# Patient Record
Sex: Male | Born: 1965 | Race: White | Hispanic: No | Marital: Married | Smoking: Never smoker
Health system: Western US, Academic
[De-identification: ages and names within clinical notes are randomized; demographics above are authoritative.]

## PROBLEM LIST (undated history)

## (undated) DIAGNOSIS — N200 Calculus of kidney: Secondary | ICD-10-CM

## (undated) DIAGNOSIS — F419 Anxiety disorder, unspecified: Secondary | ICD-10-CM

## (undated) HISTORY — PX: VASECTOMY REVERSAL: SHX243

## (undated) HISTORY — PX: VASECTOMY: SHX75

## (undated) HISTORY — PX: WISDOM TOOTH EXTRACTION: SHX21

## (undated) HISTORY — PX: KNEE SURGERY: SHX244

## (undated) HISTORY — PX: TONSILLECTOMY: SUR1361

## (undated) HISTORY — DX: Calculus of kidney: N20.0

---

## 2004-02-06 ENCOUNTER — Emergency Department (HOSPITAL_COMMUNITY): Admission: EM | Admit: 2004-02-06 | Discharge: 2004-02-06 | Payer: Self-pay | Admitting: Emergency Medicine

## 2005-04-03 ENCOUNTER — Ambulatory Visit: Payer: Self-pay | Admitting: Family Medicine

## 2005-07-05 ENCOUNTER — Ambulatory Visit: Payer: Self-pay | Admitting: Internal Medicine

## 2006-01-03 ENCOUNTER — Ambulatory Visit: Payer: Self-pay | Admitting: Internal Medicine

## 2006-01-07 ENCOUNTER — Ambulatory Visit: Payer: Self-pay | Admitting: Internal Medicine

## 2006-01-14 ENCOUNTER — Ambulatory Visit: Payer: Self-pay | Admitting: Internal Medicine

## 2007-02-13 ENCOUNTER — Ambulatory Visit: Payer: Self-pay | Admitting: Internal Medicine

## 2007-09-18 ENCOUNTER — Ambulatory Visit: Payer: Self-pay | Admitting: Internal Medicine

## 2007-09-18 DIAGNOSIS — M722 Plantar fascial fibromatosis: Secondary | ICD-10-CM | POA: Insufficient documentation

## 2007-09-21 ENCOUNTER — Telehealth (INDEPENDENT_AMBULATORY_CARE_PROVIDER_SITE_OTHER): Payer: Self-pay | Admitting: *Deleted

## 2007-10-15 ENCOUNTER — Telehealth (INDEPENDENT_AMBULATORY_CARE_PROVIDER_SITE_OTHER): Payer: Self-pay | Admitting: *Deleted

## 2008-01-01 ENCOUNTER — Encounter: Payer: Self-pay | Admitting: Internal Medicine

## 2008-08-01 ENCOUNTER — Telehealth: Payer: Self-pay | Admitting: Internal Medicine

## 2008-08-12 ENCOUNTER — Ambulatory Visit (HOSPITAL_COMMUNITY): Admission: RE | Admit: 2008-08-12 | Discharge: 2008-08-12 | Payer: Self-pay | Admitting: Gastroenterology

## 2009-02-21 ENCOUNTER — Encounter: Payer: Self-pay | Admitting: Internal Medicine

## 2009-02-28 ENCOUNTER — Encounter: Payer: Self-pay | Admitting: Internal Medicine

## 2009-03-16 ENCOUNTER — Emergency Department (HOSPITAL_COMMUNITY): Admission: EM | Admit: 2009-03-16 | Discharge: 2009-03-16 | Payer: Self-pay | Admitting: Emergency Medicine

## 2011-02-25 ENCOUNTER — Ambulatory Visit (INDEPENDENT_AMBULATORY_CARE_PROVIDER_SITE_OTHER): Payer: BC Managed Care – PPO | Admitting: Family Medicine

## 2011-02-25 ENCOUNTER — Encounter: Payer: Self-pay | Admitting: Family Medicine

## 2011-02-25 VITALS — BP 112/82 | Temp 97.9°F | Ht 70.5 in | Wt 220.0 lb

## 2011-02-25 DIAGNOSIS — J209 Acute bronchitis, unspecified: Secondary | ICD-10-CM

## 2011-02-25 NOTE — Progress Notes (Signed)
Healthy nonsmoker who is seen with onset 5 days ago sore throat and mild headache. Developed acute body aches. No fever. Congestion initially nasal he mailed in his chest. Cough occasionally productive of yellow sputum. Patient has no history of asthma and no history of smoking. Denies any nausea, vomiting, or diarrhea. No sick contacts. His continued exercise regularly.   Past medical history reveals no chronic medical problems  Review of systems as per history of present illness.  Physical examination patient is alert and healthy in appearance. Oropharynx is pink mucosa and no erythema or exudate. Dear tones are normal  Neck is supple no adenopathy Chest clear to auscultation no wheezes or rales  Heart regular rhythm and rate no murmur  Skin no rash    acute bronchitis. Suspect viral. Observe for now. Avoid strenuous exercise until this is resolving

## 2011-02-25 NOTE — Patient Instructions (Signed)
Bronchitis   Bronchitis is the body's way of reacting to injury and/or infection (inflammation) of the bronchi. Bronchi are the air tubes that extend from the windpipe into the lungs. If the inflammation becomes severe, it may cause shortness of breath.  CAUSES Inflammation may be caused by:  A virus.  Germs (bacteria).  Dust.  Allergens.  Pollutants and many other irritants.    The cells lining this bronchial tree are covered with tiny hairs (cilia). These constantly beat upward away from the lungs towards the mouth. This keeps the lung free of pollutants. When these cells become too irritated and unable to do their job, mucus begins to develop. This causes the characteristic cough of bronchitis. The cough clears our lungs when the cilia are unable to do their job. Without either of these protective mechanisms, the mucus would settle in our lungs. We would then develop pneumonia.    Smoking is a common cause of bronchitis and can contribute to pneumonia. Stopping this habit is the single most important thing you can do to help yourself.   TREATMENT  Your caregiver may prescribe an antibiotic if your cough is caused by bacteria. Also, medicines that open up your airways make it easier to breathe. He/she may also recommend or prescribe an expectorant. It will loosen the mucus to be coughed up. Only take over-the-counter or prescription medicines for pain, discomfort, or fever as directed by your caregiver.   Removing whatever causes the problem (smoking, for example) is critical to preventing the problem from getting worse.  Cough suppressants may be prescribed for relief of cough symptoms.  Inhaled medicines may be prescribed to help with symptoms now and to help prevent problems from returning.  For those with chronic (recurrent) bronchitis, there may be a need for steroid medicines.   SEEK IMMEDIATE MEDICAL CARE IF:  During your treatment you develop more pus-like (purulent)  sputum.  You have a fever uncontrolled by medicine.  You become progressively more ill.   You have increased difficulty breathing, have wheezing, or shortness of breath.   It is necessary to seek immediate medical care if you are elderly or sick from any other disease.   MAKE SURE YOU:   Understand these instructions.   Will watch your condition.  Will get help right away if you are not doing well or get worse.   Document Released: 12/16/2005  Document Re-Released: 01/07/2010 [facility] Patient Information 2011 [place], [place].

## 2011-05-17 NOTE — Assessment & Plan Note (Signed)
[facility] OFFICE NOTE   NAME:Luis Delgado, Luis Delgado                       MRN:          [id number]  DATE:02/13/2007                            DOB:          08-08-1966    A 45 year old gentleman seen today with some neurologic concerns.  He  had similar concerns 13 months ago where he noticed some perhaps  unsteadiness of his left leg.  This has not been progressive.  He also  feels that he is having some difficulty pronouncing some words but this  has not been noticed by his wife or any other observers.  He continues  to be quite active with basketball and jogging.  He also described some  tightness in both legs and perhaps some increasing tremor involving the  hands.   Exam today revealed him to be in no distress.  Blood pressure was  controlled, 130/82 on repeat.  There is no drift to the outstretched arm  and no obvious tremor.  There was no obvious dystaxia.  Finger-to-nose  testing, fine motor activity, rapid alternating movements were all  performed quite well.  Romberg was negative.  He was able to do a tandem  walk without difficulty.  He was able to hop on either foot without  problems.   IMPRESSION:  Unremarkable clinical examination.   DISPOSITION:  Options were discussed.  He was told the likelihood of him  having any serious neurological deficit is quite low.  He will consider  things over the weekend, and if he desires further testing for peace of  mind, will re-contact the office.     Gordy Savers, MD  Electronically Signed    PFK/MedQ  DD: 02/13/2007  DT: 02/13/2007  Job #: [id number]

## 2011-08-16 ENCOUNTER — Ambulatory Visit (INDEPENDENT_AMBULATORY_CARE_PROVIDER_SITE_OTHER): Payer: BC Managed Care – PPO | Admitting: Internal Medicine

## 2011-08-16 ENCOUNTER — Encounter: Payer: Self-pay | Admitting: Internal Medicine

## 2011-08-16 VITALS — BP 126/80 | Temp 98.0°F | Wt 223.0 lb

## 2011-08-16 DIAGNOSIS — K625 Hemorrhage of anus and rectum: Secondary | ICD-10-CM

## 2011-08-16 MED ORDER — HYDROCORTISONE 2.5 % RE CREA: TOPICAL_CREAM | RECTAL | Status: AC

## 2011-08-16 NOTE — Patient Instructions (Signed)
Annual exam as scheduled  Call or return to clinic prn if these symptoms worsen or fail to improve as anticipated.  

## 2011-08-16 NOTE — Progress Notes (Signed)
  Subjective:    Patient ID: Luis Delgado, male    DOB: Jun 24, 1966, 45 y.o.   MRN: [id number]  HPI  45 year old patient who presents with a several month history is scanty bright red rectal bleeding. He describes some mild rectal irritation and itching. He notices frequently bright red blood on the tissue paper. His bowel habits otherwise have been unchanged. He is scheduled for a physical in a month or so;  he has not used any preparations such as Anusol.    Review of Systems  Gastrointestinal: Positive for blood in stool.       Objective:   Physical Exam  Constitutional: He appears well-developed and well-nourished. No distress.       Blood pressure controlled  Genitourinary:       Stool is normal color and hematest negative Some prominent perirectal mucosa but no obvious hemorrhoid noted. No obvious bleeding demonstrated. No rectal mass          Assessment & Plan:   Bright red rectal bleeding. Probable small internal hemorrhoid. Will treat with Anusol-HC cream and observe. If symptoms persist will set up for general surgical evaluation. He is scheduled for a physical in one or 2 months we'll reassess at that time

## 2011-09-13 ENCOUNTER — Encounter: Payer: Self-pay | Admitting: Internal Medicine

## 2011-09-13 ENCOUNTER — Ambulatory Visit (INDEPENDENT_AMBULATORY_CARE_PROVIDER_SITE_OTHER): Payer: BC Managed Care – PPO | Admitting: Internal Medicine

## 2011-09-13 VITALS — BP 102/68 | HR 72 | Temp 98.0°F | Resp 18 | Ht 70.75 in | Wt 218.0 lb

## 2011-09-13 DIAGNOSIS — Z0000 Encounter for general adult medical examination without abnormal findings: Secondary | ICD-10-CM

## 2011-09-13 LAB — CBC WITH DIFFERENTIAL/PLATELET
Basophils Absolute: 0 10*3/uL (ref 0.0–0.1)
Eosinophils Absolute: 0 10*3/uL (ref 0.0–0.7)
Lymphocytes Relative: 20.2 % (ref 12.0–46.0)
MCHC: 34.1 g/dL (ref 30.0–36.0)
Neutrophils Relative %: 68.1 % (ref 43.0–77.0)
RDW: 13.7 % (ref 11.5–14.6)
WBC: 6.1 10*3/uL (ref 4.5–10.5)

## 2011-09-13 LAB — COMPREHENSIVE METABOLIC PANEL
ALT: 25 U/L (ref 0–53)
Alkaline Phosphatase: 92 U/L (ref 39–117)
Creatinine, Ser: 1.1 mg/dL (ref 0.4–1.5)
Glucose, Bld: 98 mg/dL (ref 70–99)
Sodium: 139 mEq/L (ref 135–145)
Total Bilirubin: 0.7 mg/dL (ref 0.3–1.2)
Total Protein: 7.1 g/dL (ref 6.0–8.3)

## 2011-09-13 LAB — LIPID PANEL
HDL: 59.1 mg/dL (ref >39.00)
Total CHOL/HDL Ratio: 3
Triglycerides: 47 mg/dL (ref 0.0–149.0)
VLDL: 9.4 mg/dL (ref 0.0–40.0)

## 2011-09-13 NOTE — Progress Notes (Signed)
  Subjective:    Patient ID: Luis Delgado, male    DOB: 1966-10-02, 45 y.o.   MRN: [id number]  HPI  45 year old patient who is seen today for an annual physical.   He enjoys excellent health and is quite active physically. Due to some atypical chest pain he had a cardiac evaluation done earlier in the year that included a nuclear medicine stress test. New concerns or complaints  Family history father age 81 status post CABG at age 78. Mother age 71. Both parents with diabetes 3 brothers in good health  Social history married 2 children high school age 68 adopted younger children. Employed at [employer]    Review of Systems  Constitutional: Negative for fever, chills, activity change, appetite change and fatigue.  HENT: Negative for hearing loss, ear pain, congestion, rhinorrhea, sneezing, mouth sores, trouble swallowing, neck pain, neck stiffness, dental problem, voice change, sinus pressure and tinnitus.   Eyes: Negative for photophobia, pain, redness and visual disturbance.  Respiratory: Negative for apnea, cough, choking, chest tightness, shortness of breath and wheezing.   Cardiovascular: Negative for chest pain, palpitations and leg swelling.  Gastrointestinal: Negative for nausea, vomiting, abdominal pain, diarrhea, constipation, blood in stool, abdominal distention, anal bleeding and rectal pain.  Genitourinary: Negative for dysuria, urgency, frequency, hematuria, flank pain, decreased urine volume, discharge, penile swelling, scrotal swelling, difficulty urinating, genital sores and testicular pain.  Musculoskeletal: Negative for myalgias, back pain, joint swelling, arthralgias and gait problem.  Skin: Negative for color change, rash and wound.  Neurological: Negative for dizziness, tremors, seizures, syncope, facial asymmetry, speech difficulty, weakness, light-headedness, numbness and headaches.  Hematological: Negative for adenopathy. Does not bruise/bleed easily.    Psychiatric/Behavioral: Negative for suicidal ideas, hallucinations, behavioral problems, confusion, sleep disturbance, self-injury, dysphoric mood, decreased concentration and agitation. The patient is not nervous/anxious.        Objective:   Physical Exam  Constitutional: He appears well-developed and well-nourished.  HENT:  Head: Normocephalic and atraumatic.  Right Ear: External ear normal.  Left Ear: External ear normal.  Nose: Nose normal.  Mouth/Throat: Oropharynx is clear and moist.  Eyes: Conjunctivae and EOM are normal. Pupils are equal, round, and reactive to light. No scleral icterus.  Neck: Normal range of motion. Neck supple. No JVD present. No thyromegaly present.  Cardiovascular: Regular rhythm, normal heart sounds and intact distal pulses.  Exam reveals no gallop and no friction rub.   No murmur heard. Pulmonary/Chest: Effort normal and breath sounds normal. He exhibits no tenderness.  Abdominal: Soft. Bowel sounds are normal. He exhibits no distension and no mass. There is no tenderness.  Genitourinary: Penis normal.  Musculoskeletal: Normal range of motion. He exhibits no edema and no tenderness.  Lymphadenopathy:    He has no cervical adenopathy.  Neurological: He is alert. He has normal reflexes. No cranial nerve deficit. Coordination normal.  Skin: Skin is warm and dry. No rash noted.  Psychiatric: He has a normal mood and affect. His behavior is normal.          Assessment & Plan:     Preventive health examination. Exercise will be continued;  modest weight loss encouraged a fasting panel will be reviewed. He return in one year or when necessary

## 2011-09-13 NOTE — Patient Instructions (Signed)
It is important that you exercise regularly, at least 20 minutes 3 to 4 times per week.  If you develop chest pain or shortness of breath seek  medical attention.  You need to lose weight.  Consider a lower calorie diet and regular exercise. 

## 2013-01-21 ENCOUNTER — Telehealth: Payer: Self-pay | Admitting: Internal Medicine

## 2013-01-21 MED ORDER — ATOVAQUONE-PROGUANIL HCL 250-100 MG PO TABS: 1.0000 | ORAL_TABLET | Freq: Every day | ORAL | Status: DC

## 2013-01-21 NOTE — Telephone Encounter (Signed)
Rx sent to pharmacy and patient is aware 

## 2013-01-21 NOTE — Telephone Encounter (Signed)
Patient Information:  Caller Name: Caedmon  Phone: [phone]  Patient: Luis, Delgado  Gender: Male  DOB: 06-02-1966  Age: 47 Years  PCP: Eleonore Chiquito [facility])  Office Follow Up:  Does the office need to follow up with this patient?: Yes  Instructions For The Office: See RN Note. Please call patient regarding prescription.  RN Note:  Patient requesting Malarone (anti-malarial) prescription for travel to [place] on 02.01.2014. Patient's preferred pharmacy is [facility] on [address] in [place], [place]. Patient will await call back regarding prescription.  Symptoms  Reason For Call & Symptoms: Requesting Malarone prescription for traval to [place] on 02.01.14  Reviewed Health History In EMR: Yes  Reviewed Medications In EMR: Yes  Reviewed Allergies In EMR: Yes  Reviewed Surgeries / Procedures: Yes  Date of Onset of Symptoms: 01/21/2013  Guideline(s) Used:  No Protocol Available - Information Only  Disposition Per Guideline:   Discuss with PCP and Callback by Nurse Today  Reason For Disposition Reached:   Nursing judgment  Advice Given:  N/A

## 2013-03-04 ENCOUNTER — Other Ambulatory Visit (INDEPENDENT_AMBULATORY_CARE_PROVIDER_SITE_OTHER): Payer: BC Managed Care – PPO

## 2013-03-04 DIAGNOSIS — Z0000 Encounter for general adult medical examination without abnormal findings: Secondary | ICD-10-CM

## 2013-03-04 LAB — CBC WITH DIFFERENTIAL/PLATELET
Basophils Relative: 0.7 % (ref 0.0–3.0)
Eosinophils Relative: 1.4 % (ref 0.0–5.0)
HCT: 46.5 % (ref 39.0–52.0)
Hemoglobin: 15.7 g/dL (ref 13.0–17.0)
Lymphs Abs: 2.2 10*3/uL (ref 0.7–4.0)
MCV: 89.1 fl (ref 78.0–100.0)
Monocytes Absolute: 0.6 10*3/uL (ref 0.1–1.0)
RBC: 5.22 Mil/uL (ref 4.22–5.81)
WBC: 6.9 10*3/uL (ref 4.5–10.5)

## 2013-03-04 LAB — POCT URINALYSIS DIPSTICK
Ketones, UA: NEGATIVE
Leukocytes, UA: NEGATIVE
Protein, UA: NEGATIVE
pH, UA: 5.5

## 2013-03-04 LAB — LIPID PANEL
Total CHOL/HDL Ratio: 3
VLDL: 11.4 mg/dL (ref 0.0–40.0)

## 2013-03-04 LAB — HEPATIC FUNCTION PANEL
ALT: 27 U/L (ref 0–53)
Bilirubin, Direct: 0.1 mg/dL (ref 0.0–0.3)
Total Protein: 7.2 g/dL (ref 6.0–8.3)

## 2013-03-04 LAB — BASIC METABOLIC PANEL
Chloride: 105 mEq/L (ref 96–112)
Potassium: 4.1 mEq/L (ref 3.5–5.1)

## 2013-03-04 LAB — TSH: TSH: 2.66 u[IU]/mL (ref 0.35–5.50)

## 2013-03-16 ENCOUNTER — Encounter: Payer: Self-pay | Admitting: Internal Medicine

## 2013-03-16 ENCOUNTER — Ambulatory Visit (INDEPENDENT_AMBULATORY_CARE_PROVIDER_SITE_OTHER): Payer: BC Managed Care – PPO | Admitting: Internal Medicine

## 2013-03-16 VITALS — BP 146/90 | HR 66 | Temp 98.0°F | Resp 18 | Ht 69.75 in | Wt 220.0 lb

## 2013-03-16 DIAGNOSIS — Z0000 Encounter for general adult medical examination without abnormal findings: Secondary | ICD-10-CM

## 2013-03-16 NOTE — Progress Notes (Signed)
  Subjective:    Patient ID: Luis Delgado, male    DOB: Mar 03, 1966, 47 y.o.   MRN: [id number]  HPI   47 year-old patient who is seen today for an annual physical.   He enjoys excellent health and is quite active physically. Due to some atypical chest pain he had a cardiac evaluation done last  year that included a nuclear medicine stress test. New concerns or complaints  Family history father age 88 status post CABG at age 622. Mother age 7. Both parents with diabetes 3 brothers in good health  Social history married 2 children high school age 63 adopted younger children. Employed at [employer]    Review of Systems  Constitutional: Negative for fever, chills, activity change, appetite change and fatigue.  HENT: Negative for hearing loss, ear pain, congestion, rhinorrhea, sneezing, mouth sores, trouble swallowing, neck pain, neck stiffness, dental problem, voice change, sinus pressure and tinnitus.   Eyes: Negative for photophobia, pain, redness and visual disturbance.  Respiratory: Negative for apnea, cough, choking, chest tightness, shortness of breath and wheezing.   Cardiovascular: Negative for chest pain, palpitations and leg swelling.  Gastrointestinal: Negative for nausea, vomiting, abdominal pain, diarrhea, constipation, blood in stool, abdominal distention, anal bleeding and rectal pain.  Genitourinary: Negative for dysuria, urgency, frequency, hematuria, flank pain, decreased urine volume, discharge, penile swelling, scrotal swelling, difficulty urinating, genital sores and testicular pain.  Musculoskeletal: Negative for myalgias, back pain, joint swelling, arthralgias and gait problem.  Skin: Negative for color change, rash and wound.  Neurological: Negative for dizziness, tremors, seizures, syncope, facial asymmetry, speech difficulty, weakness, light-headedness, numbness and headaches.  Hematological: Negative for adenopathy. Does not bruise/bleed easily.   Psychiatric/Behavioral: Negative for suicidal ideas, hallucinations, behavioral problems, confusion, sleep disturbance, self-injury, dysphoric mood, decreased concentration and agitation. The patient is not nervous/anxious.        Objective:   Physical Exam  Constitutional: He appears well-developed and well-nourished.  HENT:  Head: Normocephalic and atraumatic.  Right Ear: External ear normal.  Left Ear: External ear normal.  Nose: Nose normal.  Mouth/Throat: Oropharynx is clear and moist.  Eyes: Conjunctivae and EOM are normal. Pupils are equal, round, and reactive to light. No scleral icterus.  Neck: Normal range of motion. Neck supple. No JVD present. No thyromegaly present.  Cardiovascular: Regular rhythm, normal heart sounds and intact distal pulses.  Exam reveals no gallop and no friction rub.   No murmur heard. Pulmonary/Chest: Effort normal and breath sounds normal. He exhibits no tenderness.  Abdominal: Soft. Bowel sounds are normal. He exhibits no distension and no mass. There is no tenderness.  Genitourinary: Prostate normal and penis normal. Guaiac negative stool.  Musculoskeletal: Normal range of motion. He exhibits no edema and no tenderness.  Lymphadenopathy:    He has no cervical adenopathy.  Neurological: He is alert. He has normal reflexes. No cranial nerve deficit. Coordination normal.  Skin: Skin is warm and dry. No rash noted.  Psychiatric: He has a normal mood and affect. His behavior is normal.          Assessment & Plan:     Preventive health examination. Exercise will be continued;  modest weight loss encouraged a fasting panel will be reviewed. He return in one year or when necessary

## 2013-03-16 NOTE — Patient Instructions (Signed)
It is important that you exercise regularly, at least 20 minutes 3 to 4 times per week.  If you develop chest pain or shortness of breath seek  medical attention.   

## 2013-07-12 ENCOUNTER — Telehealth: Payer: Self-pay | Admitting: Internal Medicine

## 2013-07-12 MED ORDER — ATOVAQUONE-PROGUANIL HCL 62.5-25 MG PO TABS: 1.0000 | ORAL_TABLET | Freq: Every day | ORAL | Status: DC

## 2013-07-12 NOTE — Telephone Encounter (Signed)
malarone  #20  One daily starting 2 days prior to departure

## 2013-07-12 NOTE — Telephone Encounter (Signed)
PT is calling to request a RX to prevent Malaria. He states that he'll be leaving for [place] on 7/31 and will be there until 8/9. He would like this RX called into [facility] on [address]. Please assist.

## 2013-07-12 NOTE — Telephone Encounter (Signed)
Left message on voicemail to call office. Rx sent

## 2013-07-13 NOTE — Telephone Encounter (Signed)
PT aware

## 2013-07-14 ENCOUNTER — Other Ambulatory Visit: Payer: Self-pay | Admitting: *Deleted

## 2013-07-14 MED ORDER — ATOVAQUONE-PROGUANIL HCL 250-100 MG PO TABS: 1.0000 | ORAL_TABLET | Freq: Every day | ORAL | Status: DC

## 2013-07-14 NOTE — Telephone Encounter (Signed)
Spoke to pt told him correct Rx sent to pharmacy originally sent pediatric dose and pharmacy contacted me. Rx will be ready later today. Told pt need to start Rx two days prior to departure and take one tablet daily x 20 days and take same time everyday and take with food or milk. Pt verbalized understanding.

## 2013-07-21 ENCOUNTER — Encounter: Payer: Self-pay | Admitting: Family Medicine

## 2013-07-21 ENCOUNTER — Ambulatory Visit (INDEPENDENT_AMBULATORY_CARE_PROVIDER_SITE_OTHER): Payer: BC Managed Care – PPO | Admitting: Family Medicine

## 2013-07-21 VITALS — BP 112/74 | HR 74 | Temp 98.0°F | Wt 220.0 lb

## 2013-07-21 DIAGNOSIS — Z5189 Encounter for other specified aftercare: Secondary | ICD-10-CM

## 2013-07-21 DIAGNOSIS — T24201D Burn of second degree of unspecified site of right lower limb, except ankle and foot, subsequent encounter: Secondary | ICD-10-CM

## 2013-07-21 MED ORDER — SILVER SULFADIAZINE 1 % EX CREA: TOPICAL_CREAM | Freq: Two times a day (BID) | CUTANEOUS | Status: DC

## 2013-07-21 MED ORDER — CEPHALEXIN 500 MG PO CAPS: 500.0000 mg | ORAL_CAPSULE | Freq: Two times a day (BID) | ORAL | Status: DC
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
## 2013-07-26 ENCOUNTER — Telehealth: Payer: Self-pay | Admitting: Internal Medicine
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
## 2013-07-27 ENCOUNTER — Encounter: Payer: Self-pay | Admitting: Internal Medicine

## 2013-07-27 ENCOUNTER — Ambulatory Visit (INDEPENDENT_AMBULATORY_CARE_PROVIDER_SITE_OTHER): Payer: BC Managed Care – PPO | Admitting: Internal Medicine

## 2013-07-27 VITALS — BP 120/80 | HR 68 | Temp 98.1°F | Resp 20 | Wt 220.0 lb

## 2013-07-27 DIAGNOSIS — Z23 Encounter for immunization: Secondary | ICD-10-CM

## 2013-07-27 DIAGNOSIS — T24201S Burn of second degree of unspecified site of right lower limb, except ankle and foot, sequela: Secondary | ICD-10-CM

## 2013-07-27 DIAGNOSIS — IMO0002 Reserved for concepts with insufficient information to code with codable children: Secondary | ICD-10-CM

## 2013-07-27 NOTE — Patient Instructions (Signed)
Call or return to clinic prn if these symptoms worsen or fail to improve as anticipated. Burn Care Your skin is a natural barrier to infection. It is the largest organ of your body. Burns damage this natural protection. To help prevent infection, it is very important to follow your caregiver's instructions in the care of your burn. Burns are classified as:  First degree. There is only redness of the skin (erythema). No scarring is expected.  Second degree. There is blistering of the skin. Scarring may occur with deeper burns.  Third degree. All layers of the skin are injured, and scarring is expected. HOME CARE INSTRUCTIONS   Wash your hands well before changing your bandage.  Change your bandage as often as directed by your caregiver.  Remove the old bandage. If the bandage sticks, you may soak it off with cool, clean water.  Cleanse the burn thoroughly but gently with mild soap and water.  Pat the area dry with a clean, dry cloth.  Apply a thin layer of antibacterial cream to the burn.  Apply a clean bandage as instructed by your caregiver.  Keep the bandage as clean and dry as possible.  Elevate the affected area for the first 24 hours, then as instructed by your caregiver.  Only take over-the-counter or prescription medicines for pain, discomfort, or fever as directed by your caregiver. SEEK IMMEDIATE MEDICAL CARE IF:   You develop excessive pain.  You develop redness, tenderness, swelling, or red streaks near the burn.  The burned area develops yellowish-white fluid (pus) or a bad smell.  You have a fever. MAKE SURE YOU:   Understand these instructions.  Will watch your condition.  Will get help right away if you are not doing well or get worse. Document Released: 12/16/2005 Document Revised: 03/09/2012 Document Reviewed: 05/08/2011 [facility] Patient Information 2014 [place], [place].
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
## 2013-08-16 ENCOUNTER — Telehealth: Payer: Self-pay | Admitting: Internal Medicine

## 2013-08-16 MED ORDER — ATOVAQUONE-PROGUANIL HCL 250-100 MG PO TABS: 1.0000 | ORAL_TABLET | Freq: Every day | ORAL | Status: DC

## 2013-08-16 NOTE — Telephone Encounter (Signed)
PT is calling to request a RX to prevent Malaria. He states that he'll be leaving for [place] on 8/22 and will be returning on 8/7. He would like this RX called into [facility] on [address]. Please assist.

## 2013-08-16 NOTE — Telephone Encounter (Signed)
Spoke to pt told him Rx sent to pharmacy same directions as last time. Pt verbalized understanding.

## 2013-08-27 ENCOUNTER — Telehealth: Payer: Self-pay | Admitting: Internal Medicine

## 2013-08-27 NOTE — Telephone Encounter (Signed)
Discontinue Malarone.  Suggest a insect repellent if he is not already using

## 2013-08-27 NOTE — Telephone Encounter (Signed)
Spoke to Vernona Rieger pt's wife told her to tell him to stop Malarone and use insect repellent if not already using. Vernona Rieger verbalized understanding and will let pt know.

## 2013-08-27 NOTE — Telephone Encounter (Signed)
Patient Information:  Caller Name: Vernona Rieger  Phone: [phone]  Patient: Luis Delgado, Luis Delgado  Gender: Male  DOB: Oct 26, 1966  Age: 47 Years  PCP: Eleonore Chiquito (Family Practice > 46yrs old)  Office Follow Up:  Does the office need to follow up with this patient?: Yes  Instructions For The Office: Wife /Laura  requesting recommendation for Marquavion who is out of country in [place]. He is taking Malarone and thinks it is making him sick. Having nausea, no appetite, dizzy and not sleeping.   Symptoms  Reason For Call & Symptoms: Vernona Rieger /Wife states Jovanie is in [place].  Vernona Rieger states that  Christos thinks the prophylactic Malarone is making him sick, nausea , dizzy, no appetiteand trouble sleeping. Vernona Rieger states the " incidence of malaria in  [place] is low." Requesting recommendations.  Per no protocol for sick adult has discuss with PCP and callback by nurse today. PLEASE CALL LAURA st [phone].  Reviewed Health History In EMR: Yes  Reviewed Medications In EMR: Yes  Reviewed Allergies In EMR: Yes  Reviewed Surgeries / Procedures: Yes  Date of Onset of Symptoms: 08/23/2013  Treatments Tried: Has Pepto Bismol Tabs with him  Treatments Tried Worked: No  Guideline(s) Used:  No Protocol Available - Sick Adult  Disposition Per Guideline:   Discuss with PCP and Callback by Nurse within 1 Hour  Reason For Disposition Reached:   Nursing judgment  Advice Given:  N/A  Patient Will Follow Care Advice:  YES

## 2013-09-12 ENCOUNTER — Encounter (HOSPITAL_COMMUNITY): Payer: Self-pay

## 2013-09-12 ENCOUNTER — Emergency Department (HOSPITAL_COMMUNITY)
Admission: EM | Admit: 2013-09-12 | Discharge: 2013-09-12 | Disposition: A | Discharge status: Home / Self Care | Payer: BC Managed Care – PPO | Attending: Emergency Medicine | Admitting: Emergency Medicine

## 2013-09-12 DIAGNOSIS — Z79899 Other long term (current) drug therapy: Secondary | ICD-10-CM | POA: Insufficient documentation

## 2013-09-12 DIAGNOSIS — Z792 Long term (current) use of antibiotics: Secondary | ICD-10-CM | POA: Insufficient documentation

## 2013-09-12 DIAGNOSIS — F419 Anxiety disorder, unspecified: Secondary | ICD-10-CM

## 2013-09-12 DIAGNOSIS — F411 Generalized anxiety disorder: Secondary | ICD-10-CM | POA: Insufficient documentation

## 2013-09-12 DIAGNOSIS — R42 Dizziness and giddiness: Secondary | ICD-10-CM

## 2013-09-12 HISTORY — DX: Anxiety disorder, unspecified: F41.9

## 2013-09-12 NOTE — ED Provider Notes (Signed)
CSN: [id number]     Arrival date & time 09/12/13  1557 History   First MD Initiated Contact with Patient 09/12/13 1615     Chief Complaint  Patient presents with  . Anxiety  . Dizziness   (Consider location/radiation/quality/duration/timing/severity/associated sxs/prior Treatment) HPI Comments: 47 year old male who reports a long-standing history of anxiety presenting with dizziness which he believes is secondary to his anxiety. He states his symptoms have ebbed and flowed over the years.  He reports a lightheaded feeling and in noodleing feeling in his legs. He denies syncope. He denies vomiting. He denies worse symptoms in the morning. He denies weight loss. He denies fevers. He denies numbness or weakness.  Patient is a 47 y.o. male presenting with anxiety.  Anxiety This is a chronic problem. Episode onset: worse over past several months. Episode frequency: intermittetn. The problem has been gradually worsening. Pertinent negatives include no chest pain, no abdominal pain and no shortness of breath. Associated symptoms comments: dizziness. Nothing aggravates the symptoms. Nothing relieves the symptoms.    Past Medical History  Diagnosis Date  . Anxiety    Past Surgical History  Procedure Laterality Date  . Vasectomy reversal    . Vasectomy    . Tonsillectomy     History reviewed. No pertinent family history. History  Substance Use Topics  . Smoking status: Never Smoker   . Smokeless tobacco: Never Used  . Alcohol Use: No    Review of Systems  Constitutional: Negative for fever.  HENT: Negative for congestion.   Respiratory: Negative for cough and shortness of breath.   Cardiovascular: Negative for chest pain.  Gastrointestinal: Negative for nausea, vomiting, abdominal pain and diarrhea.  All other systems reviewed and are negative.    Allergies  Review of patient's allergies indicates no known allergies.  Home Medications   Current Outpatient Rx  Name  Route   Sig  Dispense  Refill  . atovaquone-proguanil (MALARONE) 250-100 MG TABS   Oral   Take 1 tablet by mouth daily.   20 tablet   0   . cephALEXin (KEFLEX) 500 MG capsule   Oral   Take 1 capsule (500 mg total) by mouth 2 (two) times daily.   20 capsule   0   . silver sulfADIAZINE (SILVADENE) 1 % cream   Topical   Apply topically 2 (two) times daily.   400 g   0    BP 135/79  Pulse 68  Temp(Src) 98 F (36.7 C)  Resp 18  Ht 5\' 11"  (1.803 m)  Wt 215 lb (97.523 kg)  BMI 30 kg/m2  SpO2 96% Physical Exam  Nursing note and vitals reviewed. Constitutional: He is oriented to person, place, and time. He appears well-developed and well-nourished. No distress.  HENT:  Head: Normocephalic and atraumatic.  Mouth/Throat: Oropharynx is clear and moist.  Eyes: Conjunctivae are normal. Pupils are equal, round, and reactive to light. No scleral icterus.  Neck: Neck supple.  Cardiovascular: Normal rate, regular rhythm, normal heart sounds and intact distal pulses.   No murmur heard. Pulmonary/Chest: Effort normal and breath sounds normal. No stridor. No respiratory distress. He has no wheezes. He has no rales.  Abdominal: Soft. He exhibits no distension. There is no tenderness.  Musculoskeletal: Normal range of motion. He exhibits no edema.  Neurological: He is alert and oriented to person, place, and time. He has normal strength. No cranial nerve deficit or sensory deficit. Coordination and gait normal. GCS eye subscore is 4. GCS verbal subscore  is 5. GCS motor subscore is 6.  Reflex Scores:      Patellar reflexes are 2+ on the right side and 2+ on the left side. Skin: Skin is warm and dry. No rash noted.  Psychiatric: He has a normal mood and affect. His behavior is normal.    ED Course  Procedures (including critical care time) Labs Review Labs Reviewed - No data to display Imaging Review No results found.  MDM   1. Dizziness   2. Anxiety    47 year old male presenting with  dizziness in the setting of worsening anxiety. He does not appear anxious on my exam endorses minimal symptoms at this time. I spent much time discussing his symptoms, and he ultimately felt that he needs to talk with someone regarding his anxiety. He was offered resources for this and advised to followup with his primary physician as well. He declined medications at this time. He was very well appearing with normal neurologic exam. Normal vital signs. Normal gait. I do not suspect CVA or CNS infection.      Candyce Churn, MD 09/12/13 [phone]

## 2013-09-12 NOTE — ED Notes (Signed)
Patient reports having progressively worse dizziness over the past [redacted] weeks along with anxiety. Patient also c/o epigastric pain, but denies CP. Patient states upper and lower extremities "feel weird.". Patient also states he has trouble focusing.

## 2013-09-14 ENCOUNTER — Ambulatory Visit (INDEPENDENT_AMBULATORY_CARE_PROVIDER_SITE_OTHER): Payer: BC Managed Care – PPO | Admitting: Internal Medicine

## 2013-09-14 ENCOUNTER — Encounter: Payer: Self-pay | Admitting: Internal Medicine

## 2013-09-14 VITALS — BP 122/80 | HR 72 | Temp 98.0°F | Resp 20 | Wt 218.0 lb

## 2013-09-14 DIAGNOSIS — F411 Generalized anxiety disorder: Secondary | ICD-10-CM

## 2013-09-14 MED ORDER — CLONAZEPAM 0.5 MG PO TABS: 0.5000 mg | ORAL_TABLET | Freq: Two times a day (BID) | ORAL | Status: DC | PRN

## 2013-09-14 MED ORDER — ESCITALOPRAM OXALATE 10 MG PO TABS: 10.0000 mg | ORAL_TABLET | Freq: Every day | ORAL | Status: DC

## 2013-09-14 NOTE — Progress Notes (Signed)
  Subjective:    Patient ID: Luis Delgado, male    DOB: 1966/02/17, 47 y.o.   MRN: [id number]  HPI 47 year old patient who states that he has a history of anxiety/panic disorder since age 59. In the past he has had the ED visits do to anxiety and chest pain. Over the past several months he has had the increase in generalized anxiety. Associated symptoms include the dizziness. At times he has difficulty sleeping do to intrusive thoughts and  excess  worry.  He also has difficulty with  giving presentations ; in  the past he has been quite comfortable with  public speaking   Past Medical History  Diagnosis Date  . Anxiety     History   Social History  . Marital Status: Married    Spouse Name: N/A    Number of Children: N/A  . Years of Education: N/A   Occupational History  . Not on file.   Social History Main Topics  . Smoking status: Never Smoker   . Smokeless tobacco: Never Used  . Alcohol Use: No  . Drug Use: No  . Sexual Activity: Not on file   Other Topics Concern  . Not on file   Social History Narrative  . No narrative on file    Past Surgical History  Procedure Laterality Date  . Vasectomy reversal    . Vasectomy    . Tonsillectomy      No family history on file.  No Known Allergies  Current Outpatient Prescriptions on File Prior to Visit  Medication Sig Dispense Refill  . Nutritional Supplements (JUICE PLUS FIBRE) LIQD Take 1 Bottle by mouth daily.       No current facility-administered medications on file prior to visit.    BP 122/80  Pulse 72  Temp(Src) 98 F (36.7 C) (Oral)  Resp 20  Wt 218 lb (98.884 kg)  BMI 30.42 kg/m2  SpO2 98%     Review of Systems  Psychiatric/Behavioral: The patient is nervous/anxious.        Objective:   Physical Exam  Constitutional: He is oriented to person, place, and time. He appears well-developed and well-nourished. No distress.  HENT:  Head: Normocephalic.  Right Ear: External ear normal.  Left Ear:  External ear normal.  Eyes: Conjunctivae and EOM are normal.  Neck: Normal range of motion.  Cardiovascular: Normal rate and normal heart sounds.   Pulmonary/Chest: Breath sounds normal.  Abdominal: Bowel sounds are normal.  Musculoskeletal: Normal range of motion. He exhibits no edema and no tenderness.  Neurological: He is alert and oriented to person, place, and time.  Psychiatric: He has a normal mood and affect. His behavior is normal.          Assessment & Plan:  Anxiety disorder. The patient will consider counseling. He will be placed on Lexapro 10 and when necessary Klonopin. Recheck 6 weeks

## 2013-09-14 NOTE — Patient Instructions (Signed)
Return in 6 weeks for follow up

## 2013-10-25 ENCOUNTER — Ambulatory Visit: Payer: BC Managed Care – PPO | Admitting: Internal Medicine

## 2013-12-02 ENCOUNTER — Ambulatory Visit (INDEPENDENT_AMBULATORY_CARE_PROVIDER_SITE_OTHER): Payer: BC Managed Care – PPO | Admitting: *Deleted

## 2013-12-02 DIAGNOSIS — Z23 Encounter for immunization: Secondary | ICD-10-CM

## 2013-12-17 ENCOUNTER — Encounter: Payer: Self-pay | Admitting: *Deleted

## 2013-12-20 ENCOUNTER — Encounter: Payer: Self-pay | Admitting: Internal Medicine

## 2013-12-20 ENCOUNTER — Ambulatory Visit (INDEPENDENT_AMBULATORY_CARE_PROVIDER_SITE_OTHER): Payer: BC Managed Care – PPO | Admitting: Internal Medicine

## 2013-12-20 VITALS — BP 110/80 | Temp 97.4°F | Wt 229.0 lb

## 2013-12-20 DIAGNOSIS — Z23 Encounter for immunization: Secondary | ICD-10-CM

## 2013-12-20 MED ORDER — MEFLOQUINE HCL 250 MG PO TABS: ORAL_TABLET | ORAL | Status: DC

## 2013-12-20 NOTE — Patient Instructions (Signed)
Malaria °Malaria is an illness caused by a parasite, usually an infected mosquito. A parasite is an organism that lives in another host (such as a human) and gets nourishment at the host's expense. °CAUSES  °Malaria is spread from person to person by anopheline mosquitos. Areas where malaria occurs are tropical regions of: °· Sub-Saharan Africa. °· Asia. °· Ocean[place] ([place] and near-by [place]). °· [place]. °SYMPTOMS  °Malaria may cause very mild symptoms, severe disease and even death.  °Following the bite of an infected mosquito, a period of time goes by (incubation period) before the first symptoms appear. The incubation period is typically 8 to 25 days. Patients with uncomplicated malaria typically have a fever of unknown cause. Other common symptoms include: °· Headache. °· Weakness. °· Night sweats. °· Muscle and joint pains. °Fever may come and go, recurring every 2 to 3 days. As a general rule, all travelers that get a fever and who have visited a place where malaria is common, should be considered to have malaria until or unless the diagnosis is disproved. °Severe disease typically occurs in a person with no prior malaria illness and an infection with only one of the four different malaria species (plasmodium falciparum). Symptoms may include: °· Drowsiness. °· Seizures. °· Shortness of breath. °· Severe weakness. °· Loss of appetite. °· Vomiting. °· Very high fever. °Physical findings of severe malaria may include: °· Elevated temperature. °· Paleness of the skin (may be a sign of anemia). °· Large spleen or liver. °· Yellowish color of the skin and usually white part of the eyes (jaundice). °· Severe drowsiness. °· Convulsions. °· Very low blood pressure. °MALARIA RELAPSES °Sometimes the symptoms of malaria go away by themselves or can come back after malaria is treated with medicine. This is called a relapse. It occurs because the malaria may lie inactive or sleeping (dormant) in the  liver.  °DIAGNOSIS  °Your caregiver will be able to diagnose malaria by finding parasites on a blood smear examined under a microscope. Other lab work may also be done. °PREVENTION  °Travelers should take precautions so they do not get malaria when they visit a malaria risk area. Prevention of malaria can aim at: °· Preventing infection by avoiding bites by parasite-carrying mosquitoes. °· This includes protection measures such as insecticide treated bed nets (ITN's). When used correctly, ITNs prevent mosquito bites and decrease the spread of malaria. °· Preventing disease by using anti-malarial drugs. The drugs do not prevent initial infection through a mosquito bite, but they prevent the development of malaria parasites in the blood, which are the forms that cause disease. All travelers to areas of the world where malaria occurs should discuss taking anti-malarial drugs with their caregiver before leaving to their destination. °· Mosquito control. °TREATMENT  °Medications are available for the treatment of malaria. °Document Released: 07/30/2004 Document Revised: 03/09/2012 Document Reviewed: 03/21/2009 °[employer]® Patient Information ©2014 [employer]. ° °

## 2013-12-20 NOTE — Progress Notes (Signed)
   Subjective:    Patient ID: Luis Delgado, male    DOB: [birth year], 47 y.o.   MRN: [id number]  HPI Pre-visit discussion using our clinic review tool. No additional management support is needed unless otherwise documented below in the visit note.  47 year old patient who is seen today in followup. He has done well on Lexapro for a chronic anxiety. He will be returning to [place] in 2 weeks for a 2 week stay. He is requesting a different malarial prophylactic medication due to dizziness.  He will eventually be relocating to [place] long-term and will need to consider continuing long-term prophylaxis.  Past Medical History  Diagnosis Date  . Anxiety     History   Social History  . Marital Status: Married    Spouse Name: N/A    Number of Children: N/A  . Years of Education: N/A   Occupational History  . Not on file.   Social History Main Topics  . Smoking status: Never Smoker   . Smokeless tobacco: Never Used  . Alcohol Use: No  . Drug Use: No  . Sexual Activity: Not on file   Other Topics Concern  . Not on file   Social History Narrative  . No narrative on file    Past Surgical History  Procedure Laterality Date  . Vasectomy reversal    . Vasectomy    . Tonsillectomy      No family history on file.  No Known Allergies  Current Outpatient Prescriptions on File Prior to Visit  Medication Sig Dispense Refill  . escitalopram (LEXAPRO) 10 MG tablet Take 1 tablet (10 mg total) by mouth daily.  90 tablet  2  . Nutritional Supplements (JUICE PLUS FIBRE) LIQD Take 1 Bottle by mouth daily.       No current facility-administered medications on file prior to visit.    BP 110/80  Temp(Src) 97.4 F (36.3 C) (Oral)  Wt 229 lb (103.874 kg)       Review of Systems  Psychiatric/Behavioral: The patient is nervous/anxious.        Objective:   Physical Exam  Constitutional: He appears well-developed and well-nourished. No distress.  Psychiatric: He has  a normal mood and affect. His behavior is normal. Judgment and thought content normal.          Assessment & Plan:   Generalized anxiety disorder. Much improved. We'll continue present therapy. He has not taken any anxiolytics Malaria prophylaxis. Will treat with Mefloquine starting now and to continue 4 weeks following his return

## 2013-12-20 NOTE — Progress Notes (Signed)
Pre visit review using our clinic review tool, if applicable. No additional management support is needed unless otherwise documented below in the visit note. 

## 2014-05-17 ENCOUNTER — Other Ambulatory Visit (INDEPENDENT_AMBULATORY_CARE_PROVIDER_SITE_OTHER): Payer: BC Managed Care – PPO

## 2014-05-17 DIAGNOSIS — Z0000 Encounter for general adult medical examination without abnormal findings: Secondary | ICD-10-CM

## 2014-05-17 LAB — LIPID PANEL
CHOLESTEROL: 217 mg/dL — AB (ref 0–200)
HDL: 50.1 mg/dL (ref >39.00)
LDL Cholesterol: 150 mg/dL — ABNORMAL HIGH (ref 0–99)
TRIGLYCERIDES: 86 mg/dL (ref 0.0–149.0)
Total CHOL/HDL Ratio: 4
VLDL: 17.2 mg/dL (ref 0.0–40.0)

## 2014-05-17 LAB — BASIC METABOLIC PANEL
BUN: 14 mg/dL (ref 6–23)
CALCIUM: 9.2 mg/dL (ref 8.4–10.5)
CHLORIDE: 105 meq/L (ref 96–112)
CO2: 26 meq/L (ref 19–32)
CREATININE: 1.1 mg/dL (ref 0.4–1.5)
GFR: 77.88 mL/min (ref >60.00)
Glucose, Bld: 94 mg/dL (ref 70–99)
Potassium: 3.9 mEq/L (ref 3.5–5.1)
Sodium: 138 mEq/L (ref 135–145)

## 2014-05-17 LAB — HEPATIC FUNCTION PANEL
ALT: 63 U/L — ABNORMAL HIGH (ref 0–53)
AST: 42 U/L — ABNORMAL HIGH (ref 0–37)
Albumin: 4.2 g/dL (ref 3.5–5.2)
Alkaline Phosphatase: 83 U/L (ref 39–117)
BILIRUBIN DIRECT: 0 mg/dL (ref 0.0–0.3)
BILIRUBIN TOTAL: 0.8 mg/dL (ref 0.2–1.2)
TOTAL PROTEIN: 7.4 g/dL (ref 6.0–8.3)

## 2014-05-17 LAB — POCT URINALYSIS DIPSTICK
BILIRUBIN UA: NEGATIVE
Blood, UA: NEGATIVE
GLUCOSE UA: NEGATIVE
KETONES UA: NEGATIVE
LEUKOCYTES UA: NEGATIVE
Nitrite, UA: NEGATIVE
Spec Grav, UA: 1.02
Urobilinogen, UA: 0.2
pH, UA: 7.5

## 2014-05-17 LAB — CBC WITH DIFFERENTIAL/PLATELET
BASOS ABS: 0 10*3/uL (ref 0.0–0.1)
Basophils Relative: 0.4 % (ref 0.0–3.0)
EOS ABS: 0.2 10*3/uL (ref 0.0–0.7)
Eosinophils Relative: 4.4 % (ref 0.0–5.0)
HCT: 44.1 % (ref 39.0–52.0)
Hemoglobin: 15.1 g/dL (ref 13.0–17.0)
LYMPHS PCT: 37 % (ref 12.0–46.0)
Lymphs Abs: 2 10*3/uL (ref 0.7–4.0)
MCHC: 34.2 g/dL (ref 30.0–36.0)
MCV: 88.7 fl (ref 78.0–100.0)
MONO ABS: 0.4 10*3/uL (ref 0.1–1.0)
Monocytes Relative: 8.3 % (ref 3.0–12.0)
NEUTROS PCT: 49.9 % (ref 43.0–77.0)
Neutro Abs: 2.7 10*3/uL (ref 1.4–7.7)
PLATELETS: 212 10*3/uL (ref 150.0–400.0)
RBC: 4.98 Mil/uL (ref 4.22–5.81)
RDW: 14.4 % (ref 11.5–15.5)
WBC: 5.4 10*3/uL (ref 4.0–10.5)

## 2014-05-17 LAB — TSH: TSH: 2.25 u[IU]/mL (ref 0.35–4.50)

## 2014-05-17 NOTE — Addendum Note (Signed)
Addended by: Elmer Picker on: 05/17/2014 09:18 AM   Modules accepted: Orders

## 2014-05-24 ENCOUNTER — Encounter: Payer: Self-pay | Admitting: Internal Medicine

## 2014-05-24 ENCOUNTER — Ambulatory Visit (INDEPENDENT_AMBULATORY_CARE_PROVIDER_SITE_OTHER): Payer: BC Managed Care – PPO | Admitting: Internal Medicine

## 2014-05-24 VITALS — BP 132/80 | HR 70 | Temp 97.6°F | Ht 71.0 in | Wt 233.0 lb

## 2014-05-24 DIAGNOSIS — Z0000 Encounter for general adult medical examination without abnormal findings: Secondary | ICD-10-CM

## 2014-05-24 MED ORDER — ESCITALOPRAM OXALATE 10 MG PO TABS: 10.0000 mg | ORAL_TABLET | Freq: Every day | ORAL | Status: DC

## 2014-05-24 NOTE — Patient Instructions (Addendum)
It is important that you exercise regularly, at least 20 minutes 3 to 4 times per week.  If you develop chest pain or shortness of breath seek  medical attention.  You need to lose weight.  Consider a lower calorie diet and regular exercise.  Call or return to clinic prn if these symptoms worsen or fail to improve as anticipated. Health Maintenance, Males A healthy lifestyle and preventative care can promote health and wellness.  Maintain regular health, dental, and eye exams.  Eat a healthy diet. Foods like vegetables, fruits, whole grains, low-fat dairy products, and lean protein foods contain the nutrients you need and are low in calories. Decrease your intake of foods high in solid fats, added sugars, and salt. Get information about a proper diet from your health care provider, if necessary.  Regular physical exercise is one of the most important things you can do for your health. Most adults should get at least 150 minutes of moderate-intensity exercise (any activity that increases your heart rate and causes you to sweat) each week. In addition, most adults need muscle-strengthening exercises on 2 or more days a week.   Maintain a healthy weight. The body mass index (BMI) is a screening tool to identify possible weight problems. It provides an estimate of body fat based on height and weight. Your health care provider can find your BMI and can help you achieve or maintain a healthy weight. For males 20 years and older:  A BMI below 18.5 is considered underweight.  A BMI of 18.5 to 24.9 is normal.  A BMI of 25 to 29.9 is considered overweight.  A BMI of 30 and above is considered obese.  Maintain normal blood lipids and cholesterol by exercising and minimizing your intake of saturated fat. Eat a balanced diet with plenty of fruits and vegetables. Blood tests for lipids and cholesterol should begin at age 52 and be repeated every 5 years. If your lipid or cholesterol levels are high, you  are over 50, or you are at high risk for heart disease, you may need your cholesterol levels checked more frequently.Ongoing high lipid and cholesterol levels should be treated with medicines, if diet and exercise are not working.  If you smoke, find out from your health care provider how to quit. If you do not use tobacco, do not start.  Lung cancer screening is recommended for adults aged 69 80 years who are at high risk for developing lung cancer because of a history of smoking. A yearly low-dose CT scan of the lungs is recommended for people who have at least a 30-pack-year history of smoking and are a current smoker or have quit within the past 15 years. A pack year of smoking is smoking an average of 1 pack of cigarettes a day for 1 year (for example, a 30-pack-year history of smoking could mean smoking 1 pack a day for 30 years or 2 packs a day for 15 years). Yearly screening should continue until the smoker has stopped smoking for at least 15 years. Yearly screening should be stopped for people who develop a health problem that would prevent them from having lung cancer treatment.  If you choose to drink alcohol, do not have more than 2 drinks per day. One drink is considered to be 12 oz (360 mL) of beer, 5 oz (150 mL) of wine, or 1.5 oz (45 mL) of liquor.  Avoid use of street drugs. Do not share needles with anyone. Ask for help if  you need support or instructions about stopping the use of drugs.  High blood pressure causes heart disease and increases the risk of stroke. Blood pressure should be checked at least every 1 2 years. Ongoing high blood pressure should be treated with medicines if weight loss and exercise are not effective.  If you are 69 48 years old, ask your health care provider if you should take aspirin to prevent heart disease.  Diabetes screening involves taking a blood sample to check your fasting blood sugar level. This should be done once every 3 years after age 78, if  you are at a normal weight and without risk factors for diabetes. Testing should be considered at a younger age or be carried out more frequently if you are overweight and have at least 1 risk factor for diabetes.  Colorectal cancer can be detected and often prevented. Most routine colorectal cancer screening begins at the age of 51 and continues through age 44. However, your health care provider may recommend screening at an earlier age if you have risk factors for colon cancer. On a yearly basis, your health care provider may provide home test kits to check for hidden blood in the stool. A small camera at the end of a tube may be used to directly examine the colon (sigmoidoscopy or colonoscopy) to detect the earliest forms of colorectal cancer. Talk to your health care provider about this at age 92, when routine screening begins. A direct exam of the colon should be repeated every 5 10 years through age 50, unless early forms of pre-cancerous polyps or small growths are found.  People who are at an increased risk for hepatitis B should be screened for this virus. You are considered at high risk for hepatitis B if:  You were born in a country where hepatitis B occurs often. Talk with your health care provider about which countries are considered high-risk.  Your parents were born in a high-risk country and you have not received a shot to protect against hepatitis B (hepatitis B vaccine).  You have HIV or AIDS.  You use needles to inject street drugs.  You live with, or have sex with, someone who has hepatitis B.  You are a man who has sex with other men (MSM).  You get hemodialysis treatment.  You take certain medicines for conditions like cancer, organ transplantation, and autoimmune conditions.  Hepatitis C blood testing is recommended for all people born from [birth year] through 1964 and any individual with known risk factors for hepatitis C.  Healthy men should no longer receive  prostate-specific antigen (PSA) blood tests as part of routine cancer screening. Talk to your health care provider about prostate cancer screening.  Testicular cancer screening is not recommended for adolescents or adult males who have no symptoms. Screening includes self-exam, a health care provider exam, and other screening tests. Consult with your health care provider about any symptoms you have or any concerns you have about testicular cancer.  Practice safe sex. Use condoms and avoid high-risk sexual practices to reduce the spread of sexually transmitted infections (STIs).  Use sunscreen. Apply sunscreen liberally and repeatedly throughout the day. You should seek shade when your shadow is shorter than you. Protect yourself by wearing long sleeves, pants, a wide-brimmed hat, and sunglasses year round, whenever you are outdoors.  Tell your health care provider of new moles or changes in moles, especially if there is a change in shape or color. Also tell your provider if a  mole is larger than the size of a pencil eraser.  A one-time screening for abdominal aortic aneurysm (AAA) and surgical repair of large AAAs by ultrasound is recommended for men aged 60 75 years who are current or former smokers.  Stay current with your vaccines (immunizations). Document Released: 06/13/2008 Document Revised: 10/06/2013 Document Reviewed: 05/13/2011 [facility] Patient Information 2014 [place], [place].

## 2014-05-24 NOTE — Progress Notes (Signed)
  Subjective:    Patient ID: Luis Delgado, male    DOB: 12-Jan-1966, 48 y.o.   MRN: [id number]  HPI 48  year-old patient who is seen today for an annual physical.    He enjoys excellent health and is quite active physically. Due to some atypical chest pain he had a cardiac evaluation done in the past that included a nuclear medicine stress test.  No New concerns or complaints  Family history father age 17 status post CABG at age 67. Mother age 34. Both parents with diabetes 3 brothers in good health  Social history married 2 children high school age 27 adopted younger children. 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  HENT: Negative for congestion, dental problem, ear pain, hearing loss, mouth sores, rhinorrhea, sinus pressure, sneezing, tinnitus, trouble swallowing and voice change.   Eyes: Negative for photophobia, pain, redness and visual disturbance.  Respiratory: Negative for apnea, cough, choking, chest tightness, shortness of breath and wheezing.   Cardiovascular: Negative for chest pain, palpitations and leg swelling.  Gastrointestinal: Negative for nausea, vomiting, abdominal pain, diarrhea, constipation, blood in stool, abdominal distention, anal bleeding and rectal pain.  Genitourinary: Negative for dysuria, urgency, frequency, hematuria, flank pain, decreased urine volume, discharge, penile swelling, scrotal swelling, difficulty urinating, genital sores and testicular pain.  Musculoskeletal: Negative for arthralgias, back pain, gait problem, joint swelling, myalgias, neck pain and neck stiffness.  Skin: Negative for color change, rash and wound.  Neurological: Negative for dizziness, tremors, seizures, syncope, facial asymmetry, speech difficulty, weakness, light-headedness, numbness and headaches.   Hematological: Negative for adenopathy. Does not bruise/bleed easily.  Psychiatric/Behavioral: Negative for suicidal ideas, hallucinations, behavioral problems, confusion, sleep disturbance, self-injury, dysphoric mood, decreased concentration and agitation. The patient is not nervous/anxious.        Objective:   Physical Exam  Constitutional: He appears well-developed and well-nourished.  HENT:  Head: Normocephalic and atraumatic.  Right Ear: External ear normal.  Left Ear: External ear normal.  Nose: Nose normal.  Mouth/Throat: Oropharynx is clear and moist.  Eyes: Conjunctivae and EOM are normal. Pupils are equal, round, and reactive to light. No scleral icterus.  Neck: Normal range of motion. Neck supple. No JVD present. No thyromegaly present.  Cardiovascular: Regular rhythm, normal heart sounds and intact distal pulses.  Exam reveals no gallop and no friction rub.   No murmur heard. Pulmonary/Chest: Effort normal and breath sounds normal. He exhibits no tenderness.  Abdominal: Soft. Bowel sounds are normal. He exhibits no distension and no mass. There is no tenderness.  Genitourinary: Prostate normal and penis normal. Guaiac negative stool.  Musculoskeletal: Normal range of motion. He exhibits no edema and no tenderness.  Lymphadenopathy:    He has no cervical adenopathy.  Neurological: He is alert. He has normal reflexes. No cranial nerve deficit. Coordination normal.  Skin: Skin is warm and dry. No rash noted.  Psychiatric: He has a normal mood and affect. His behavior is normal.          Assessment & Plan:     Preventive health examination. Exercise will be continued;  modest weight loss encouraged a fasting panel will be reviewed. He return in one year or when necessary

## 2014-05-24 NOTE — Progress Notes (Signed)
Pre visit review using our clinic review tool, if applicable. No additional management support is needed unless otherwise documented below in the visit note. 

## 2015-08-04 ENCOUNTER — Telehealth: Payer: Self-pay | Admitting: Internal Medicine

## 2015-08-04 NOTE — Telephone Encounter (Signed)
Patient called from [place] and states that there is a yellow fever outbreak and needs documentation that he has the yellow fever vaccine. Patient states that [name] [phone] is POA and can pick up this information. Patient was also providing email information and the call was lost before that information was given.

## 2015-08-04 NOTE — Telephone Encounter (Signed)
Left message on machine for Mr Luis Delgado - copy ready for pick up.

## 2016-06-07 ENCOUNTER — Ambulatory Visit (INDEPENDENT_AMBULATORY_CARE_PROVIDER_SITE_OTHER): Payer: Managed Care, Other (non HMO) | Admitting: Internal Medicine

## 2016-06-07 ENCOUNTER — Encounter: Payer: Self-pay | Admitting: Internal Medicine

## 2016-06-07 VITALS — BP 128/82 | HR 81 | Temp 97.8°F | Ht 71.0 in | Wt 202.0 lb

## 2016-06-07 DIAGNOSIS — K648 Other hemorrhoids: Secondary | ICD-10-CM | POA: Diagnosis not present

## 2016-06-07 DIAGNOSIS — K644 Residual hemorrhoidal skin tags: Secondary | ICD-10-CM

## 2016-06-07 MED ORDER — LIDOCAINE 5 % EX OINT: 1.0000 "application " | TOPICAL_OINTMENT | CUTANEOUS | Status: DC | PRN

## 2016-06-07 MED ORDER — HYDROCORTISONE 2.5 % RE CREA: 1.0000 "application " | TOPICAL_CREAM | Freq: Two times a day (BID) | RECTAL | Status: DC

## 2016-06-07 NOTE — Progress Notes (Signed)
   Subjective:    Patient ID: Luis Delgado, male    DOB: 1966-06-13, 50 y.o.   MRN: [id number]  HPI  50 year old patient, who presently works in [place] and is back in the [place] area only briefly.  He presents with a several day history of painful and bleeding external hemorrhoid.   He will be leaving again in 2 days.    Social history recently divorced, 2 children  Past Medical History  Diagnosis Date  . Anxiety      Social History   Social History  . Marital Status: Married    Spouse Name: N/A  . Number of Children: N/A  . Years of Education: N/A   Occupational History  . Not on file.   Social History Main Topics  . Smoking status: Never Smoker   . Smokeless tobacco: Never Used  . Alcohol Use: No  . Drug Use: No  . Sexual Activity: Not on file   Other Topics Concern  . Not on file   Social History Narrative    Past Surgical History  Procedure Laterality Date  . Vasectomy reversal    . Vasectomy    . Tonsillectomy      No family history on file.  No Known Allergies  No current outpatient prescriptions on file prior to visit.   No current facility-administered medications on file prior to visit.    BP 128/82 mmHg  Pulse 81  Temp(Src) 97.8 F (36.6 C) (Oral)  Ht 5\' 11"  (1.803 m)  Wt 202 lb (91.627 kg)  BMI 28.19 kg/m2  SpO2 98%      Review of Systems  Gastrointestinal: Positive for blood in stool, anal bleeding and rectal pain.       Objective:   Physical Exam  Genitourinary:  Tender external hemorrhoid at the 3:00 position.  No active bleeding          Assessment & Plan:   Marland Kitchen  Symptomatic external hemorrhoid.  Will treat with Anusol HC cream as well as lidocaine cane ointment.  Instructed on sitz baths.  Gen. Surgical referral if unimproved   Nyoka Cowden, MD

## 2016-06-07 NOTE — Progress Notes (Signed)
Pre visit review using our clinic review tool, if applicable. No additional management support is needed unless otherwise documented below in the visit note. 

## 2016-06-07 NOTE — Patient Instructions (Addendum)
Hemorrhoids Hemorrhoids are swollen veins around the rectum or anus. There are two types of hemorrhoids:   Internal hemorrhoids. These occur in the veins just inside the rectum. They may poke through to the outside and become irritated and painful.  External hemorrhoids. These occur in the veins outside the anus and can be felt as a painful swelling or hard lump near the anus. CAUSES 1. Pregnancy.  2. Obesity.  3. Constipation or diarrhea.  4. Straining to have a bowel movement.  5. Sitting for long periods on the toilet. 6. Heavy lifting or other activity that caused you to strain. 7. Anal intercourse. SYMPTOMS   Pain.   Anal itching or irritation.   Rectal bleeding.   Fecal leakage.   Anal swelling.   One or more lumps around the anus.  DIAGNOSIS  Your caregiver may be able to diagnose hemorrhoids by visual examination. Other examinations or tests that may be performed include:   Examination of the rectal area with a gloved hand (digital rectal exam).   Examination of anal canal using a small tube (scope).   A blood test if you have lost a significant amount of blood.  A test to look inside the colon (sigmoidoscopy or colonoscopy). TREATMENT Most hemorrhoids can be treated at home. However, if symptoms do not seem to be getting better or if you have a lot of rectal bleeding, your caregiver may perform a procedure to help make the hemorrhoids get smaller or remove them completely. Possible treatments include:   Placing a rubber band at the base of the hemorrhoid to cut off the circulation (rubber band ligation).   Injecting a chemical to shrink the hemorrhoid (sclerotherapy).   Using a tool to burn the hemorrhoid (infrared light therapy).   Surgically removing the hemorrhoid (hemorrhoidectomy).   Stapling the hemorrhoid to block blood flow to the tissue (hemorrhoid stapling).  HOME CARE INSTRUCTIONS   Eat foods with fiber, such as whole grains,  beans, nuts, fruits, and vegetables. Ask your doctor about taking products with added fiber in them (fibersupplements).  Increase fluid intake. Drink enough water and fluids to keep your urine clear or pale yellow.   Exercise regularly.   Go to the bathroom when you have the urge to have a bowel movement. Do not wait.   Avoid straining to have bowel movements.   Keep the anal area dry and clean. Use wet toilet paper or moist towelettes after a bowel movement.   Medicated creams and suppositories may be used or applied as directed.   Only take over-the-counter or prescription medicines as directed by your caregiver.   Take warm sitz baths for 15-20 minutes, 3-4 times a day to ease pain and discomfort.   Place ice packs on the hemorrhoids if they are tender and swollen. Using ice packs between sitz baths may be helpful.   Put ice in a plastic bag.   Place a towel between your skin and the bag.   Leave the ice on for 15-20 minutes, 3-4 times a day.   Do not use a donut-shaped pillow or sit on the toilet for long periods. This increases blood pooling and pain.  SEEK MEDICAL CARE IF:  You have increasing pain and swelling that is not controlled by treatment or medicine.  You have uncontrolled bleeding.  You have difficulty or you are unable to have a bowel movement.  You have pain or inflammation outside the area of the hemorrhoids. MAKE SURE YOU:  Understand these instructions.  Will watch your condition.  Will get help right away if you are not doing well or get worse.   This information is not intended to replace advice given to you by your health care provider. Make sure you discuss any questions you have with your health care provider.   Document Released: 12/13/2000 Document Revised: 12/02/2012 Document Reviewed: 10/20/2012 Elsevier Interactive Patient Education 2016 [employer] Nonsurgical Procedures for Hemorrhoids Nonsurgical procedures can be  used to treat hemorrhoids. Hemorrhoids are swollen veins that are inside the rectum (internal hemorrhoids) or around the anus (external hemorrhoids). They are caused by increased pressure in the anal area. This pressure may result from straining to have a bowel movement (constipation), diarrhea, pregnancy, obesity, anal sex, or sitting for long periods of time. Hemorrhoids can cause symptoms such as pain and bleeding. Various procedures may be performed if diet changes, lifestyle changes, and other treatments do not help your symptoms. Some of these procedures do not involve surgery. Three common nonsurgical procedures are:  Rubber band ligation. Rubber bands are used to cut off the blood supply to the hemorrhoids.  Sclerotherapy. Medicine is injected into the hemorrhoids to shrink them.  Infrared coagulation. A type of light energy is used to get rid of the hemorrhoids. LET [facility] CARE PROVIDER KNOW ABOUT: 8. Any allergies you have. 9. All medicines you are taking, including vitamins, herbs, eye drops, creams, and over-the-counter medicines. 10. Previous problems you or members of your family have had with the use of anesthetics. 11. Any blood disorders you have. 12. Previous surgeries you have had. 13. Any medical conditions you have. 14. Whether you are pregnant or may be pregnant. RISKS AND COMPLICATIONS Generally, this is a safe procedure. However, problems may occur, including:  Infection.  Bleeding.  Pain. BEFORE THE PROCEDURE  Ask your health care provider about:  Changing or stopping your regular medicines. This is especially important if you are taking diabetes medicines or blood thinners.  Taking medicines such as aspirin and ibuprofen. These medicines can thin your blood. Do not take these medicines before your procedure if your health care provider instructs you not to.  You may need to have a procedure to examine the inside of your colon with a scope (colonoscopy).  Your health care provider may do this to make sure that there are no other causes for your bleeding or pain. PROCEDURE  Your health care provider will clean your rectal area with a rinsing solution.  A lubricating jelly may be placed into your rectum. The jelly may contain a medicine to numb the area (local anesthetic).  Your health care provider will insert a short scope (anoscope) into your rectum to examine the hemorrhoids.  One of the following techniques will be used. Rubber Band Ligation Your health care provider will place medical instruments through the scope to put rubber bands around the base of your hemorrhoids. The bands will cut off the blood supply to the hemorrhoids. The hemorrhoids will fall off after several days. Sclerotherapy Your health care provider will inject medicine through the scope into your hemorrhoids. This will cause them to shrink and dry up. Infrared Coagulation Your health care provider will shine a type of light through the scope onto your hemorrhoids. This light will generate energy (infrared radiation). It will cause the hemorrhoids to scar and then fall off. Each of these procedures may vary among health care providers and hospitals. AFTER THE PROCEDURE  You will be monitored to make sure that you have  no bleeding.  Return to your normal activities as told by your health care provider.   This information is not intended to replace advice given to you by your health care provider. Make sure you discuss any questions you have with your health care provider.   Document Released: 10/13/2009 Document Revised: 09/06/2015 Document Reviewed: 03/13/2015 Elsevier Interactive Patient Education 2016 [employer] Nonsurgical Procedures for Hemorrhoids Nonsurgical procedures can be used to treat hemorrhoids. Hemorrhoids are swollen veins that are inside the rectum (internal hemorrhoids) or around the anus (external hemorrhoids). They are caused by increased  pressure in the anal area. This pressure may result from straining to have a bowel movement (constipation), diarrhea, pregnancy, obesity, anal sex, or sitting for long periods of time. Hemorrhoids can cause symptoms such as pain and bleeding. Various procedures may be performed if diet changes, lifestyle changes, and other treatments do not help your symptoms. Some of these procedures do not involve surgery. Three common nonsurgical procedures are:  Rubber band ligation. Rubber bands are used to cut off the blood supply to the hemorrhoids.  Sclerotherapy. Medicine is injected into the hemorrhoids to shrink them.  Infrared coagulation. A type of light energy is used to get rid of the hemorrhoids. LET [facility] CARE PROVIDER KNOW ABOUT: 15. Any allergies you have. 16. All medicines you are taking, including vitamins, herbs, eye drops, creams, and over-the-counter medicines. 61. Previous problems you or members of your family have had with the use of anesthetics. 18. Any blood disorders you have. 19. Previous surgeries you have had. 20. Any medical conditions you have. 21. Whether you are pregnant or may be pregnant. RISKS AND COMPLICATIONS Generally, this is a safe procedure. However, problems may occur, including:  Infection.  Bleeding.  Pain. BEFORE THE PROCEDURE  Ask your health care provider about:  Changing or stopping your regular medicines. This is especially important if you are taking diabetes medicines or blood thinners.  Taking medicines such as aspirin and ibuprofen. These medicines can thin your blood. Do not take these medicines before your procedure if your health care provider instructs you not to.  You may need to have a procedure to examine the inside of your colon with a scope (colonoscopy). Your health care provider may do this to make sure that there are no other causes for your bleeding or pain. PROCEDURE  Your health care provider will clean your rectal area  with a rinsing solution.  A lubricating jelly may be placed into your rectum. The jelly may contain a medicine to numb the area (local anesthetic).  Your health care provider will insert a short scope (anoscope) into your rectum to examine the hemorrhoids.  One of the following techniques will be used. Rubber Band Ligation Your health care provider will place medical instruments through the scope to put rubber bands around the base of your hemorrhoids. The bands will cut off the blood supply to the hemorrhoids. The hemorrhoids will fall off after several days. Sclerotherapy Your health care provider will inject medicine through the scope into your hemorrhoids. This will cause them to shrink and dry up. Infrared Coagulation Your health care provider will shine a type of light through the scope onto your hemorrhoids. This light will generate energy (infrared radiation). It will cause the hemorrhoids to scar and then fall off. Each of these procedures may vary among health care providers and hospitals. AFTER THE PROCEDURE  You will be monitored to make sure that you have no bleeding.  Return  to your normal activities as told by your health care provider.   This information is not intended to replace advice given to you by your health care provider. Make sure you discuss any questions you have with your health care provider.   Document Released: 10/13/2009 Document Revised: 09/06/2015 Document Reviewed: 03/13/2015 Elsevier Interactive Patient Education 2016 [employer] Nonsurgical Procedures for Hemorrhoids Nonsurgical procedures can be used to treat hemorrhoids. Hemorrhoids are swollen veins that are inside the rectum (internal hemorrhoids) or around the anus (external hemorrhoids). They are caused by increased pressure in the anal area. This pressure may result from straining to have a bowel movement (constipation), diarrhea, pregnancy, obesity, anal sex, or sitting for long periods of  time. Hemorrhoids can cause symptoms such as pain and bleeding. Various procedures may be performed if diet changes, lifestyle changes, and other treatments do not help your symptoms. Some of these procedures do not involve surgery. Three common nonsurgical procedures are:  Rubber band ligation. Rubber bands are used to cut off the blood supply to the hemorrhoids.  Sclerotherapy. Medicine is injected into the hemorrhoids to shrink them.  Infrared coagulation. A type of light energy is used to get rid of the hemorrhoids. LET [facility] CARE PROVIDER KNOW ABOUT: 22. Any allergies you have. 23. All medicines you are taking, including vitamins, herbs, eye drops, creams, and over-the-counter medicines. 24. Previous problems you or members of your family have had with the use of anesthetics. 25. Any blood disorders you have. 26. Previous surgeries you have had. 27. Any medical conditions you have. 28. Whether you are pregnant or may be pregnant. RISKS AND COMPLICATIONS Generally, this is a safe procedure. However, problems may occur, including:  Infection.  Bleeding.  Pain. BEFORE THE PROCEDURE  Ask your health care provider about:  Changing or stopping your regular medicines. This is especially important if you are taking diabetes medicines or blood thinners.  Taking medicines such as aspirin and ibuprofen. These medicines can thin your blood. Do not take these medicines before your procedure if your health care provider instructs you not to.  You may need to have a procedure to examine the inside of your colon with a scope (colonoscopy). Your health care provider may do this to make sure that there are no other causes for your bleeding or pain. PROCEDURE  Your health care provider will clean your rectal area with a rinsing solution.  A lubricating jelly may be placed into your rectum. The jelly may contain a medicine to numb the area (local anesthetic).  Your health care provider  will insert a short scope (anoscope) into your rectum to examine the hemorrhoids.  One of the following techniques will be used. Rubber Band Ligation Your health care provider will place medical instruments through the scope to put rubber bands around the base of your hemorrhoids. The bands will cut off the blood supply to the hemorrhoids. The hemorrhoids will fall off after several days. Sclerotherapy Your health care provider will inject medicine through the scope into your hemorrhoids. This will cause them to shrink and dry up. Infrared Coagulation Your health care provider will shine a type of light through the scope onto your hemorrhoids. This light will generate energy (infrared radiation). It will cause the hemorrhoids to scar and then fall off. Each of these procedures may vary among health care providers and hospitals. AFTER THE PROCEDURE  You will be monitored to make sure that you have no bleeding.  Return to your normal activities  as told by your health care provider.   This information is not intended to replace advice given to you by your health care provider. Make sure you discuss any questions you have with your health care provider.   Document Released: 10/13/2009 Document Revised: 09/06/2015 Document Reviewed: 03/13/2015 Elsevier Interactive Patient Education 2016 [employer] Nonsurgical Procedures for Hemorrhoids Nonsurgical procedures can be used to treat hemorrhoids. Hemorrhoids are swollen veins that are inside the rectum (internal hemorrhoids) or around the anus (external hemorrhoids). They are caused by increased pressure in the anal area. This pressure may result from straining to have a bowel movement (constipation), diarrhea, pregnancy, obesity, anal sex, or sitting for long periods of time. Hemorrhoids can cause symptoms such as pain and bleeding. Various procedures may be performed if diet changes, lifestyle changes, and other treatments do not help your  symptoms. Some of these procedures do not involve surgery. Three common nonsurgical procedures are:  Rubber band ligation. Rubber bands are used to cut off the blood supply to the hemorrhoids.  Sclerotherapy. Medicine is injected into the hemorrhoids to shrink them.  Infrared coagulation. A type of light energy is used to get rid of the hemorrhoids. LET [facility] CARE PROVIDER KNOW ABOUT: 29. Any allergies you have. 30. All medicines you are taking, including vitamins, herbs, eye drops, creams, and over-the-counter medicines. 84. Previous problems you or members of your family have had with the use of anesthetics. 32. Any blood disorders you have. 33. Previous surgeries you have had. 34. Any medical conditions you have. 71. Whether you are pregnant or may be pregnant. RISKS AND COMPLICATIONS Generally, this is a safe procedure. However, problems may occur, including:  Infection.  Bleeding.  Pain. BEFORE THE PROCEDURE  Ask your health care provider about:  Changing or stopping your regular medicines. This is especially important if you are taking diabetes medicines or blood thinners.  Taking medicines such as aspirin and ibuprofen. These medicines can thin your blood. Do not take these medicines before your procedure if your health care provider instructs you not to.  You may need to have a procedure to examine the inside of your colon with a scope (colonoscopy). Your health care provider may do this to make sure that there are no other causes for your bleeding or pain. PROCEDURE  Your health care provider will clean your rectal area with a rinsing solution.  A lubricating jelly may be placed into your rectum. The jelly may contain a medicine to numb the area (local anesthetic).  Your health care provider will insert a short scope (anoscope) into your rectum to examine the hemorrhoids.  One of the following techniques will be used. Rubber Band Ligation Your health care  provider will place medical instruments through the scope to put rubber bands around the base of your hemorrhoids. The bands will cut off the blood supply to the hemorrhoids. The hemorrhoids will fall off after several days. Sclerotherapy Your health care provider will inject medicine through the scope into your hemorrhoids. This will cause them to shrink and dry up. Infrared Coagulation Your health care provider will shine a type of light through the scope onto your hemorrhoids. This light will generate energy (infrared radiation). It will cause the hemorrhoids to scar and then fall off. Each of these procedures may vary among health care providers and hospitals. AFTER THE PROCEDURE  You will be monitored to make sure that you have no bleeding.  Return to your normal activities as told by your  health care provider.   This information is not intended to replace advice given to you by your health care provider. Make sure you discuss any questions you have with your health care provider.   Document Released: 10/13/2009 Document Revised: 09/06/2015 Document Reviewed: 03/13/2015 Elsevier Interactive Patient Education 2016 [employer] Nonsurgical Procedures for Hemorrhoids Nonsurgical procedures can be used to treat hemorrhoids. Hemorrhoids are swollen veins that are inside the rectum (internal hemorrhoids) or around the anus (external hemorrhoids). They are caused by increased pressure in the anal area. This pressure may result from straining to have a bowel movement (constipation), diarrhea, pregnancy, obesity, anal sex, or sitting for long periods of time. Hemorrhoids can cause symptoms such as pain and bleeding. Various procedures may be performed if diet changes, lifestyle changes, and other treatments do not help your symptoms. Some of these procedures do not involve surgery. Three common nonsurgical procedures are:  Rubber band ligation. Rubber bands are used to cut off the blood supply to  the hemorrhoids.  Sclerotherapy. Medicine is injected into the hemorrhoids to shrink them.  Infrared coagulation. A type of light energy is used to get rid of the hemorrhoids. LET [facility] CARE PROVIDER KNOW ABOUT: 36. Any allergies you have. 65. All medicines you are taking, including vitamins, herbs, eye drops, creams, and over-the-counter medicines. 66. Previous problems you or members of your family have had with the use of anesthetics. 39. Any blood disorders you have. 40. Previous surgeries you have had. 41. Any medical conditions you have. 42. Whether you are pregnant or may be pregnant. RISKS AND COMPLICATIONS Generally, this is a safe procedure. However, problems may occur, including:  Infection.  Bleeding.  Pain. BEFORE THE PROCEDURE  Ask your health care provider about:  Changing or stopping your regular medicines. This is especially important if you are taking diabetes medicines or blood thinners.  Taking medicines such as aspirin and ibuprofen. These medicines can thin your blood. Do not take these medicines before your procedure if your health care provider instructs you not to.  You may need to have a procedure to examine the inside of your colon with a scope (colonoscopy). Your health care provider may do this to make sure that there are no other causes for your bleeding or pain. PROCEDURE  Your health care provider will clean your rectal area with a rinsing solution.  A lubricating jelly may be placed into your rectum. The jelly may contain a medicine to numb the area (local anesthetic).  Your health care provider will insert a short scope (anoscope) into your rectum to examine the hemorrhoids.  One of the following techniques will be used. Rubber Band Ligation Your health care provider will place medical instruments through the scope to put rubber bands around the base of your hemorrhoids. The bands will cut off the blood supply to the hemorrhoids. The  hemorrhoids will fall off after several days. Sclerotherapy Your health care provider will inject medicine through the scope into your hemorrhoids. This will cause them to shrink and dry up. Infrared Coagulation Your health care provider will shine a type of light through the scope onto your hemorrhoids. This light will generate energy (infrared radiation). It will cause the hemorrhoids to scar and then fall off. Each of these procedures may vary among health care providers and hospitals. AFTER THE PROCEDURE  You will be monitored to make sure that you have no bleeding.  Return to your normal activities as told by your health care provider.  This information is not intended to replace advice given to you by your health care provider. Make sure you discuss any questions you have with your health care provider.   Document Released: 10/13/2009 Document Revised: 09/06/2015 Document Reviewed: 03/13/2015 Elsevier Interactive Patient Education 2016 [employer] Nonsurgical Procedures for Hemorrhoids Nonsurgical procedures can be used to treat hemorrhoids. Hemorrhoids are swollen veins that are inside the rectum (internal hemorrhoids) or around the anus (external hemorrhoids). They are caused by increased pressure in the anal area. This pressure may result from straining to have a bowel movement (constipation), diarrhea, pregnancy, obesity, anal sex, or sitting for long periods of time. Hemorrhoids can cause symptoms such as pain and bleeding. Various procedures may be performed if diet changes, lifestyle changes, and other treatments do not help your symptoms. Some of these procedures do not involve surgery. Three common nonsurgical procedures are:  Rubber band ligation. Rubber bands are used to cut off the blood supply to the hemorrhoids.  Sclerotherapy. Medicine is injected into the hemorrhoids to shrink them.  Infrared coagulation. A type of light energy is used to get rid of the  hemorrhoids. LET [facility] CARE PROVIDER KNOW ABOUT: 1. Any allergies you have. 44. All medicines you are taking, including vitamins, herbs, eye drops, creams, and over-the-counter medicines. 54. Previous problems you or members of your family have had with the use of anesthetics. 46. Any blood disorders you have. 47. Previous surgeries you have had. 48. Any medical conditions you have. 49. Whether you are pregnant or may be pregnant. RISKS AND COMPLICATIONS Generally, this is a safe procedure. However, problems may occur, including:  Infection.  Bleeding.  Pain. BEFORE THE PROCEDURE  Ask your health care provider about:  Changing or stopping your regular medicines. This is especially important if you are taking diabetes medicines or blood thinners.  Taking medicines such as aspirin and ibuprofen. These medicines can thin your blood. Do not take these medicines before your procedure if your health care provider instructs you not to.  You may need to have a procedure to examine the inside of your colon with a scope (colonoscopy). Your health care provider may do this to make sure that there are no other causes for your bleeding or pain. PROCEDURE  Your health care provider will clean your rectal area with a rinsing solution.  A lubricating jelly may be placed into your rectum. The jelly may contain a medicine to numb the area (local anesthetic).  Your health care provider will insert a short scope (anoscope) into your rectum to examine the hemorrhoids.  One of the following techniques will be used. Rubber Band Ligation Your health care provider will place medical instruments through the scope to put rubber bands around the base of your hemorrhoids. The bands will cut off the blood supply to the hemorrhoids. The hemorrhoids will fall off after several days. Sclerotherapy Your health care provider will inject medicine through the scope into your hemorrhoids. This will cause them  to shrink and dry up. Infrared Coagulation Your health care provider will shine a type of light through the scope onto your hemorrhoids. This light will generate energy (infrared radiation). It will cause the hemorrhoids to scar and then fall off. Each of these procedures may vary among health care providers and hospitals. AFTER THE PROCEDURE  You will be monitored to make sure that you have no bleeding.  Return to your normal activities as told by your health care provider.   This information is not  intended to replace advice given to you by your health care provider. Make sure you discuss any questions you have with your health care provider.   Document Released: 10/13/2009 Document Revised: 09/06/2015 Document Reviewed: 03/13/2015 Elsevier Interactive Patient Education 2016 [employer] Nonsurgical Procedures for Hemorrhoids Nonsurgical procedures can be used to treat hemorrhoids. Hemorrhoids are swollen veins that are inside the rectum (internal hemorrhoids) or around the anus (external hemorrhoids). They are caused by increased pressure in the anal area. This pressure may result from straining to have a bowel movement (constipation), diarrhea, pregnancy, obesity, anal sex, or sitting for long periods of time. Hemorrhoids can cause symptoms such as pain and bleeding. Various procedures may be performed if diet changes, lifestyle changes, and other treatments do not help your symptoms. Some of these procedures do not involve surgery. Three common nonsurgical procedures are:  Rubber band ligation. Rubber bands are used to cut off the blood supply to the hemorrhoids.  Sclerotherapy. Medicine is injected into the hemorrhoids to shrink them.  Infrared coagulation. A type of light energy is used to get rid of the hemorrhoids. LET [facility] CARE PROVIDER KNOW ABOUT: 50. Any allergies you have. 51. All medicines you are taking, including vitamins, herbs, eye drops, creams, and  over-the-counter medicines. 24. Previous problems you or members of your family have had with the use of anesthetics. 53. Any blood disorders you have. 54. Previous surgeries you have had. 55. Any medical conditions you have. 56. Whether you are pregnant or may be pregnant. RISKS AND COMPLICATIONS Generally, this is a safe procedure. However, problems may occur, including:  Infection.  Bleeding.  Pain. BEFORE THE PROCEDURE  Ask your health care provider about:  Changing or stopping your regular medicines. This is especially important if you are taking diabetes medicines or blood thinners.  Taking medicines such as aspirin and ibuprofen. These medicines can thin your blood. Do not take these medicines before your procedure if your health care provider instructs you not to.  You may need to have a procedure to examine the inside of your colon with a scope (colonoscopy). Your health care provider may do this to make sure that there are no other causes for your bleeding or pain. PROCEDURE  Your health care provider will clean your rectal area with a rinsing solution.  A lubricating jelly may be placed into your rectum. The jelly may contain a medicine to numb the area (local anesthetic).  Your health care provider will insert a short scope (anoscope) into your rectum to examine the hemorrhoids.  One of the following techniques will be used. Rubber Band Ligation Your health care provider will place medical instruments through the scope to put rubber bands around the base of your hemorrhoids. The bands will cut off the blood supply to the hemorrhoids. The hemorrhoids will fall off after several days. Sclerotherapy Your health care provider will inject medicine through the scope into your hemorrhoids. This will cause them to shrink and dry up. Infrared Coagulation Your health care provider will shine a type of light through the scope onto your hemorrhoids. This light will generate  energy (infrared radiation). It will cause the hemorrhoids to scar and then fall off. Each of these procedures may vary among health care providers and hospitals. AFTER THE PROCEDURE  You will be monitored to make sure that you have no bleeding.  Return to your normal activities as told by your health care provider.   This information is not intended to replace advice  given to you by your health care provider. Make sure you discuss any questions you have with your health care provider.   Document Released: 10/13/2009 Document Revised: 09/06/2015 Document Reviewed: 03/13/2015 Elsevier Interactive Patient Education 2016 [employer] Nonsurgical Procedures for Hemorrhoids Nonsurgical procedures can be used to treat hemorrhoids. Hemorrhoids are swollen veins that are inside the rectum (internal hemorrhoids) or around the anus (external hemorrhoids). They are caused by increased pressure in the anal area. This pressure may result from straining to have a bowel movement (constipation), diarrhea, pregnancy, obesity, anal sex, or sitting for long periods of time. Hemorrhoids can cause symptoms such as pain and bleeding. Various procedures may be performed if diet changes, lifestyle changes, and other treatments do not help your symptoms. Some of these procedures do not involve surgery. Three common nonsurgical procedures are:  Rubber band ligation. Rubber bands are used to cut off the blood supply to the hemorrhoids.  Sclerotherapy. Medicine is injected into the hemorrhoids to shrink them.  Infrared coagulation. A type of light energy is used to get rid of the hemorrhoids. LET [facility] CARE PROVIDER KNOW ABOUT: 80. Any allergies you have. 58. All medicines you are taking, including vitamins, herbs, eye drops, creams, and over-the-counter medicines. 28. Previous problems you or members of your family have had with the use of anesthetics. 60. Any blood disorders you have. 61. Previous surgeries  you have had. 62. Any medical conditions you have. 56. Whether you are pregnant or may be pregnant. RISKS AND COMPLICATIONS Generally, this is a safe procedure. However, problems may occur, including:  Infection.  Bleeding.  Pain. BEFORE THE PROCEDURE  Ask your health care provider about:  Changing or stopping your regular medicines. This is especially important if you are taking diabetes medicines or blood thinners.  Taking medicines such as aspirin and ibuprofen. These medicines can thin your blood. Do not take these medicines before your procedure if your health care provider instructs you not to.  You may need to have a procedure to examine the inside of your colon with a scope (colonoscopy). Your health care provider may do this to make sure that there are no other causes for your bleeding or pain. PROCEDURE  Your health care provider will clean your rectal area with a rinsing solution.  A lubricating jelly may be placed into your rectum. The jelly may contain a medicine to numb the area (local anesthetic).  Your health care provider will insert a short scope (anoscope) into your rectum to examine the hemorrhoids.  One of the following techniques will be used. Rubber Band Ligation Your health care provider will place medical instruments through the scope to put rubber bands around the base of your hemorrhoids. The bands will cut off the blood supply to the hemorrhoids. The hemorrhoids will fall off after several days. Sclerotherapy Your health care provider will inject medicine through the scope into your hemorrhoids. This will cause them to shrink and dry up. Infrared Coagulation Your health care provider will shine a type of light through the scope onto your hemorrhoids. This light will generate energy (infrared radiation). It will cause the hemorrhoids to scar and then fall off. Each of these procedures may vary among health care providers and hospitals. AFTER THE  PROCEDURE  You will be monitored to make sure that you have no bleeding.  Return to your normal activities as told by your health care provider.   This information is not intended to replace advice given to you by  your health care provider. Make sure you discuss any questions you have with your health care provider.   Document Released: 10/13/2009 Document Revised: 09/06/2015 Document Reviewed: 03/13/2015 Elsevier Interactive Patient Education 2016 [employer] Nonsurgical Procedures for Hemorrhoids Nonsurgical procedures can be used to treat hemorrhoids. Hemorrhoids are swollen veins that are inside the rectum (internal hemorrhoids) or around the anus (external hemorrhoids). They are caused by increased pressure in the anal area. This pressure may result from straining to have a bowel movement (constipation), diarrhea, pregnancy, obesity, anal sex, or sitting for long periods of time. Hemorrhoids can cause symptoms such as pain and bleeding. Various procedures may be performed if diet changes, lifestyle changes, and other treatments do not help your symptoms. Some of these procedures do not involve surgery. Three common nonsurgical procedures are:  Rubber band ligation. Rubber bands are used to cut off the blood supply to the hemorrhoids.  Sclerotherapy. Medicine is injected into the hemorrhoids to shrink them.  Infrared coagulation. A type of light energy is used to get rid of the hemorrhoids. LET [facility] CARE PROVIDER KNOW ABOUT: 27. Any allergies you have. 65. All medicines you are taking, including vitamins, herbs, eye drops, creams, and over-the-counter medicines. 66. Previous problems you or members of your family have had with the use of anesthetics. 67. Any blood disorders you have. 68. Previous surgeries you have had. 69. Any medical conditions you have. 70. Whether you are pregnant or may be pregnant. RISKS AND COMPLICATIONS Generally, this is a safe procedure. However,  problems may occur, including:  Infection.  Bleeding.  Pain. BEFORE THE PROCEDURE  Ask your health care provider about:  Changing or stopping your regular medicines. This is especially important if you are taking diabetes medicines or blood thinners.  Taking medicines such as aspirin and ibuprofen. These medicines can thin your blood. Do not take these medicines before your procedure if your health care provider instructs you not to.  You may need to have a procedure to examine the inside of your colon with a scope (colonoscopy). Your health care provider may do this to make sure that there are no other causes for your bleeding or pain. PROCEDURE  Your health care provider will clean your rectal area with a rinsing solution.  A lubricating jelly may be placed into your rectum. The jelly may contain a medicine to numb the area (local anesthetic).  Your health care provider will insert a short scope (anoscope) into your rectum to examine the hemorrhoids.  One of the following techniques will be used. Rubber Band Ligation Your health care provider will place medical instruments through the scope to put rubber bands around the base of your hemorrhoids. The bands will cut off the blood supply to the hemorrhoids. The hemorrhoids will fall off after several days. Sclerotherapy Your health care provider will inject medicine through the scope into your hemorrhoids. This will cause them to shrink and dry up. Infrared Coagulation Your health care provider will shine a type of light through the scope onto your hemorrhoids. This light will generate energy (infrared radiation). It will cause the hemorrhoids to scar and then fall off. Each of these procedures may vary among health care providers and hospitals. AFTER THE PROCEDURE  You will be monitored to make sure that you have no bleeding.  Return to your normal activities as told by your health care provider.   This information is not  intended to replace advice given to you by your health care provider.  Make sure you discuss any questions you have with your health care provider.   Document Released: 10/13/2009 Document Revised: 09/06/2015 Document Reviewed: 03/13/2015 Elsevier Interactive Patient Education 2016 [employer] Nonsurgical Procedures for Hemorrhoids Nonsurgical procedures can be used to treat hemorrhoids. Hemorrhoids are swollen veins that are inside the rectum (internal hemorrhoids) or around the anus (external hemorrhoids). They are caused by increased pressure in the anal area. This pressure may result from straining to have a bowel movement (constipation), diarrhea, pregnancy, obesity, anal sex, or sitting for long periods of time. Hemorrhoids can cause symptoms such as pain and bleeding. Various procedures may be performed if diet changes, lifestyle changes, and other treatments do not help your symptoms. Some of these procedures do not involve surgery. Three common nonsurgical procedures are:  Rubber band ligation. Rubber bands are used to cut off the blood supply to the hemorrhoids.  Sclerotherapy. Medicine is injected into the hemorrhoids to shrink them.  Infrared coagulation. A type of light energy is used to get rid of the hemorrhoids. LET [facility] CARE PROVIDER KNOW ABOUT: 46. Any allergies you have. 72. All medicines you are taking, including vitamins, herbs, eye drops, creams, and over-the-counter medicines. 73. Previous problems you or members of your family have had with the use of anesthetics. 74. Any blood disorders you have. 75. Previous surgeries you have had. 76. Any medical conditions you have. 102. Whether you are pregnant or may be pregnant. RISKS AND COMPLICATIONS Generally, this is a safe procedure. However, problems may occur, including:  Infection.  Bleeding.  Pain. BEFORE THE PROCEDURE  Ask your health care provider about:  Changing or stopping your regular medicines.  This is especially important if you are taking diabetes medicines or blood thinners.  Taking medicines such as aspirin and ibuprofen. These medicines can thin your blood. Do not take these medicines before your procedure if your health care provider instructs you not to.  You may need to have a procedure to examine the inside of your colon with a scope (colonoscopy). Your health care provider may do this to make sure that there are no other causes for your bleeding or pain. PROCEDURE  Your health care provider will clean your rectal area with a rinsing solution.  A lubricating jelly may be placed into your rectum. The jelly may contain a medicine to numb the area (local anesthetic).  Your health care provider will insert a short scope (anoscope) into your rectum to examine the hemorrhoids.  One of the following techniques will be used. Rubber Band Ligation Your health care provider will place medical instruments through the scope to put rubber bands around the base of your hemorrhoids. The bands will cut off the blood supply to the hemorrhoids. The hemorrhoids will fall off after several days. Sclerotherapy Your health care provider will inject medicine through the scope into your hemorrhoids. This will cause them to shrink and dry up. Infrared Coagulation Your health care provider will shine a type of light through the scope onto your hemorrhoids. This light will generate energy (infrared radiation). It will cause the hemorrhoids to scar and then fall off. Each of these procedures may vary among health care providers and hospitals. AFTER THE PROCEDURE  You will be monitored to make sure that you have no bleeding.  Return to your normal activities as told by your health care provider.   This information is not intended to replace advice given to you by your health care provider. Make sure you discuss any  questions you have with your health care provider.   Document Released: 10/13/2009  Document Revised: 09/06/2015 Document Reviewed: 03/13/2015 Elsevier Interactive Patient Education 2016 [employer] Nonsurgical Procedures for Hemorrhoids Nonsurgical procedures can be used to treat hemorrhoids. Hemorrhoids are swollen veins that are inside the rectum (internal hemorrhoids) or around the anus (external hemorrhoids). They are caused by increased pressure in the anal area. This pressure may result from straining to have a bowel movement (constipation), diarrhea, pregnancy, obesity, anal sex, or sitting for long periods of time. Hemorrhoids can cause symptoms such as pain and bleeding. Various procedures may be performed if diet changes, lifestyle changes, and other treatments do not help your symptoms. Some of these procedures do not involve surgery. Three common nonsurgical procedures are:  Rubber band ligation. Rubber bands are used to cut off the blood supply to the hemorrhoids.  Sclerotherapy. Medicine is injected into the hemorrhoids to shrink them.  Infrared coagulation. A type of light energy is used to get rid of the hemorrhoids. LET [facility] CARE PROVIDER KNOW ABOUT: 19. Any allergies you have. 79. All medicines you are taking, including vitamins, herbs, eye drops, creams, and over-the-counter medicines. 80. Previous problems you or members of your family have had with the use of anesthetics. 81. Any blood disorders you have. 82. Previous surgeries you have had. 83. Any medical conditions you have. 53. Whether you are pregnant or may be pregnant. RISKS AND COMPLICATIONS Generally, this is a safe procedure. However, problems may occur, including:  Infection.  Bleeding.  Pain. BEFORE THE PROCEDURE  Ask your health care provider about:  Changing or stopping your regular medicines. This is especially important if you are taking diabetes medicines or blood thinners.  Taking medicines such as aspirin and ibuprofen. These medicines can thin your blood. Do not  take these medicines before your procedure if your health care provider instructs you not to.  You may need to have a procedure to examine the inside of your colon with a scope (colonoscopy). Your health care provider may do this to make sure that there are no other causes for your bleeding or pain. PROCEDURE  Your health care provider will clean your rectal area with a rinsing solution.  A lubricating jelly may be placed into your rectum. The jelly may contain a medicine to numb the area (local anesthetic).  Your health care provider will insert a short scope (anoscope) into your rectum to examine the hemorrhoids.  One of the following techniques will be used. Rubber Band Ligation Your health care provider will place medical instruments through the scope to put rubber bands around the base of your hemorrhoids. The bands will cut off the blood supply to the hemorrhoids. The hemorrhoids will fall off after several days. Sclerotherapy Your health care provider will inject medicine through the scope into your hemorrhoids. This will cause them to shrink and dry up. Infrared Coagulation Your health care provider will shine a type of light through the scope onto your hemorrhoids. This light will generate energy (infrared radiation). It will cause the hemorrhoids to scar and then fall off. Each of these procedures may vary among health care providers and hospitals. AFTER THE PROCEDURE  You will be monitored to make sure that you have no bleeding.  Return to your normal activities as told by your health care provider.   This information is not intended to replace advice given to you by your health care provider. Make sure you discuss any questions you have with  your health care provider.   Document Released: 10/13/2009 Document Revised: 09/06/2015 Document Reviewed: 03/13/2015 Elsevier Interactive Patient Education 2016 [employer] Nonsurgical Procedures for Hemorrhoids Nonsurgical  procedures can be used to treat hemorrhoids. Hemorrhoids are swollen veins that are inside the rectum (internal hemorrhoids) or around the anus (external hemorrhoids). They are caused by increased pressure in the anal area. This pressure may result from straining to have a bowel movement (constipation), diarrhea, pregnancy, obesity, anal sex, or sitting for long periods of time. Hemorrhoids can cause symptoms such as pain and bleeding. Various procedures may be performed if diet changes, lifestyle changes, and other treatments do not help your symptoms. Some of these procedures do not involve surgery. Three common nonsurgical procedures are:  Rubber band ligation. Rubber bands are used to cut off the blood supply to the hemorrhoids.  Sclerotherapy. Medicine is injected into the hemorrhoids to shrink them.  Infrared coagulation. A type of light energy is used to get rid of the hemorrhoids. LET [facility] CARE PROVIDER KNOW ABOUT: 34. Any allergies you have. 86. All medicines you are taking, including vitamins, herbs, eye drops, creams, and over-the-counter medicines. 87. Previous problems you or members of your family have had with the use of anesthetics. 88. Any blood disorders you have. 89. Previous surgeries you have had. 90. Any medical conditions you have. 91. Whether you are pregnant or may be pregnant. RISKS AND COMPLICATIONS Generally, this is a safe procedure. However, problems may occur, including:  Infection.  Bleeding.  Pain. BEFORE THE PROCEDURE  Ask your health care provider about:  Changing or stopping your regular medicines. This is especially important if you are taking diabetes medicines or blood thinners.  Taking medicines such as aspirin and ibuprofen. These medicines can thin your blood. Do not take these medicines before your procedure if your health care provider instructs you not to.  You may need to have a procedure to examine the inside of your colon with a  scope (colonoscopy). Your health care provider may do this to make sure that there are no other causes for your bleeding or pain. PROCEDURE  Your health care provider will clean your rectal area with a rinsing solution.  A lubricating jelly may be placed into your rectum. The jelly may contain a medicine to numb the area (local anesthetic).  Your health care provider will insert a short scope (anoscope) into your rectum to examine the hemorrhoids.  One of the following techniques will be used. Rubber Band Ligation Your health care provider will place medical instruments through the scope to put rubber bands around the base of your hemorrhoids. The bands will cut off the blood supply to the hemorrhoids. The hemorrhoids will fall off after several days. Sclerotherapy Your health care provider will inject medicine through the scope into your hemorrhoids. This will cause them to shrink and dry up. Infrared Coagulation Your health care provider will shine a type of light through the scope onto your hemorrhoids. This light will generate energy (infrared radiation). It will cause the hemorrhoids to scar and then fall off. Each of these procedures may vary among health care providers and hospitals. AFTER THE PROCEDURE  You will be monitored to make sure that you have no bleeding.  Return to your normal activities as told by your health care provider.   This information is not intended to replace advice given to you by your health care provider. Make sure you discuss any questions you have with your health care provider.  Document Released: 10/13/2009 Document Revised: 09/06/2015 Document Reviewed: 03/13/2015 Elsevier Interactive Patient Education 2016 [employer] Nonsurgical Procedures for Hemorrhoids Nonsurgical procedures can be used to treat hemorrhoids. Hemorrhoids are swollen veins that are inside the rectum (internal hemorrhoids) or around the anus (external hemorrhoids). They are  caused by increased pressure in the anal area. This pressure may result from straining to have a bowel movement (constipation), diarrhea, pregnancy, obesity, anal sex, or sitting for long periods of time. Hemorrhoids can cause symptoms such as pain and bleeding. Various procedures may be performed if diet changes, lifestyle changes, and other treatments do not help your symptoms. Some of these procedures do not involve surgery. Three common nonsurgical procedures are:  Rubber band ligation. Rubber bands are used to cut off the blood supply to the hemorrhoids.  Sclerotherapy. Medicine is injected into the hemorrhoids to shrink them.  Infrared coagulation. A type of light energy is used to get rid of the hemorrhoids. LET [facility] CARE PROVIDER KNOW ABOUT: 55. Any allergies you have. 93. All medicines you are taking, including vitamins, herbs, eye drops, creams, and over-the-counter medicines. 94. Previous problems you or members of your family have had with the use of anesthetics. 95. Any blood disorders you have. 96. Previous surgeries you have had. 97. Any medical conditions you have. 98. Whether you are pregnant or may be pregnant. RISKS AND COMPLICATIONS Generally, this is a safe procedure. However, problems may occur, including:  Infection.  Bleeding.  Pain. BEFORE THE PROCEDURE  Ask your health care provider about:  Changing or stopping your regular medicines. This is especially important if you are taking diabetes medicines or blood thinners.  Taking medicines such as aspirin and ibuprofen. These medicines can thin your blood. Do not take these medicines before your procedure if your health care provider instructs you not to.  You may need to have a procedure to examine the inside of your colon with a scope (colonoscopy). Your health care provider may do this to make sure that there are no other causes for your bleeding or pain. PROCEDURE  Your health care provider will  clean your rectal area with a rinsing solution.  A lubricating jelly may be placed into your rectum. The jelly may contain a medicine to numb the area (local anesthetic).  Your health care provider will insert a short scope (anoscope) into your rectum to examine the hemorrhoids.  One of the following techniques will be used. Rubber Band Ligation Your health care provider will place medical instruments through the scope to put rubber bands around the base of your hemorrhoids. The bands will cut off the blood supply to the hemorrhoids. The hemorrhoids will fall off after several days. Sclerotherapy Your health care provider will inject medicine through the scope into your hemorrhoids. This will cause them to shrink and dry up. Infrared Coagulation Your health care provider will shine a type of light through the scope onto your hemorrhoids. This light will generate energy (infrared radiation). It will cause the hemorrhoids to scar and then fall off. Each of these procedures may vary among health care providers and hospitals. AFTER THE PROCEDURE  You will be monitored to make sure that you have no bleeding.  Return to your normal activities as told by your health care provider.   This information is not intended to replace advice given to you by your health care provider. Make sure you discuss any questions you have with your health care provider.   Document Released: 10/13/2009 Document  Revised: 09/06/2015 Document Reviewed: 03/13/2015 Elsevier Interactive Patient Education 2016 [employer] Nonsurgical Procedures for Hemorrhoids Nonsurgical procedures can be used to treat hemorrhoids. Hemorrhoids are swollen veins that are inside the rectum (internal hemorrhoids) or around the anus (external hemorrhoids). They are caused by increased pressure in the anal area. This pressure may result from straining to have a bowel movement (constipation), diarrhea, pregnancy, obesity, anal sex, or sitting  for long periods of time. Hemorrhoids can cause symptoms such as pain and bleeding. Various procedures may be performed if diet changes, lifestyle changes, and other treatments do not help your symptoms. Some of these procedures do not involve surgery. Three common nonsurgical procedures are:  Rubber band ligation. Rubber bands are used to cut off the blood supply to the hemorrhoids.  Sclerotherapy. Medicine is injected into the hemorrhoids to shrink them.  Infrared coagulation. A type of light energy is used to get rid of the hemorrhoids. LET [facility] CARE PROVIDER KNOW ABOUT: 99. Any allergies you have. 100. All medicines you are taking, including vitamins, herbs, eye drops, creams, and over-the-counter medicines. 101. Previous problems you or members of your family have had with the use of anesthetics. 102. Any blood disorders you have. 103. Previous surgeries you have had. 104. Any medical conditions you have. 105. Whether you are pregnant or may be pregnant. RISKS AND COMPLICATIONS Generally, this is a safe procedure. However, problems may occur, including:  Infection.  Bleeding.  Pain. BEFORE THE PROCEDURE  Ask your health care provider about:  Changing or stopping your regular medicines. This is especially important if you are taking diabetes medicines or blood thinners.  Taking medicines such as aspirin and ibuprofen. These medicines can thin your blood. Do not take these medicines before your procedure if your health care provider instructs you not to.  You may need to have a procedure to examine the inside of your colon with a scope (colonoscopy). Your health care provider may do this to make sure that there are no other causes for your bleeding or pain. PROCEDURE  Your health care provider will clean your rectal area with a rinsing solution.  A lubricating jelly may be placed into your rectum. The jelly may contain a medicine to numb the area (local  anesthetic).  Your health care provider will insert a short scope (anoscope) into your rectum to examine the hemorrhoids.  One of the following techniques will be used. Rubber Band Ligation Your health care provider will place medical instruments through the scope to put rubber bands around the base of your hemorrhoids. The bands will cut off the blood supply to the hemorrhoids. The hemorrhoids will fall off after several days. Sclerotherapy Your health care provider will inject medicine through the scope into your hemorrhoids. This will cause them to shrink and dry up. Infrared Coagulation Your health care provider will shine a type of light through the scope onto your hemorrhoids. This light will generate energy (infrared radiation). It will cause the hemorrhoids to scar and then fall off. Each of these procedures may vary among health care providers and hospitals. AFTER THE PROCEDURE  You will be monitored to make sure that you have no bleeding.  Return to your normal activities as told by your health care provider.   This information is not intended to replace advice given to you by your health care provider. Make sure you discuss any questions you have with your health care provider.   Document Released: 10/13/2009 Document Revised: 09/06/2015 Document Reviewed:  03/13/2015 Elsevier Interactive Patient Education 2016 [employer] Nonsurgical Procedures for Hemorrhoids Nonsurgical procedures can be used to treat hemorrhoids. Hemorrhoids are swollen veins that are inside the rectum (internal hemorrhoids) or around the anus (external hemorrhoids). They are caused by increased pressure in the anal area. This pressure may result from straining to have a bowel movement (constipation), diarrhea, pregnancy, obesity, anal sex, or sitting for long periods of time. Hemorrhoids can cause symptoms such as pain and bleeding. Various procedures may be performed if diet changes, lifestyle changes, and  other treatments do not help your symptoms. Some of these procedures do not involve surgery. Three common nonsurgical procedures are:  Rubber band ligation. Rubber bands are used to cut off the blood supply to the hemorrhoids.  Sclerotherapy. Medicine is injected into the hemorrhoids to shrink them.  Infrared coagulation. A type of light energy is used to get rid of the hemorrhoids. LET [facility] CARE PROVIDER KNOW ABOUT: 106. Any allergies you have. 107. All medicines you are taking, including vitamins, herbs, eye drops, creams, and over-the-counter medicines. 108. Previous problems you or members of your family have had with the use of anesthetics. 109. Any blood disorders you have. 110. Previous surgeries you have had. 111. Any medical conditions you have. 112. Whether you are pregnant or may be pregnant. RISKS AND COMPLICATIONS Generally, this is a safe procedure. However, problems may occur, including:  Infection.  Bleeding.  Pain. BEFORE THE PROCEDURE  Ask your health care provider about:  Changing or stopping your regular medicines. This is especially important if you are taking diabetes medicines or blood thinners.  Taking medicines such as aspirin and ibuprofen. These medicines can thin your blood. Do not take these medicines before your procedure if your health care provider instructs you not to.  You may need to have a procedure to examine the inside of your colon with a scope (colonoscopy). Your health care provider may do this to make sure that there are no other causes for your bleeding or pain. PROCEDURE  Your health care provider will clean your rectal area with a rinsing solution.  A lubricating jelly may be placed into your rectum. The jelly may contain a medicine to numb the area (local anesthetic).  Your health care provider will insert a short scope (anoscope) into your rectum to examine the hemorrhoids.  One of the following techniques will be  used. Rubber Band Ligation Your health care provider will place medical instruments through the scope to put rubber bands around the base of your hemorrhoids. The bands will cut off the blood supply to the hemorrhoids. The hemorrhoids will fall off after several days. Sclerotherapy Your health care provider will inject medicine through the scope into your hemorrhoids. This will cause them to shrink and dry up. Infrared Coagulation Your health care provider will shine a type of light through the scope onto your hemorrhoids. This light will generate energy (infrared radiation). It will cause the hemorrhoids to scar and then fall off. Each of these procedures may vary among health care providers and hospitals. AFTER THE PROCEDURE  You will be monitored to make sure that you have no bleeding.  Return to your normal activities as told by your health care provider.   This information is not intended to replace advice given to you by your health care provider. Make sure you discuss any questions you have with your health care provider.   Document Released: 10/13/2009 Document Revised: 09/06/2015 Document Reviewed: 03/13/2015 Elsevier Interactive Patient  Education 2016 Else[employer] Inc[employer] Nonsurgical Procedures for Hemorrhoids Nonsurgical procedures can be used to treat hemorrhoids. Hemorrhoids are swollen veins that are inside the rectum (internal hemorrhoids) or around the anus (external hemorrhoids). They are caused by increased pressure in the anal area. This pressure may result from straining to have a bowel movement (constipation), diarrhea, pregnancy, obesity, anal sex, or sitting for long periods of time. Hemorrhoids can cause symptoms such as pain and bleeding. Various procedures may be performed if diet changes, lifestyle changes, and other treatments do not help your symptoms. Some of these procedures do not involve surgery. Three common nonsurgical procedures are:  Rubber band ligation. Rubber  bands are used to cut off the blood supply to the hemorrhoids.  Sclerotherapy. Medicine is injected into the hemorrhoids to shrink them.  Infrared coagulation. A type of light energy is used to get rid of the hemorrhoids. LET YOUR HEALTH CARE PROVIDER KNOW ABOUT: 113. Any allergies you have. 114. All medicines you are taking, including vitamins, herbs, eye drops, creams, and over-the-counter medicines. 115. Previous problems you or members of your family have had with the use of anesthetics. 116. Any blood disorders you have. 117. Previous surgeries you have had. 118. Any medical conditions you have. 119. Whether you are pregnant or may be pregnant. RISKS AND COMPLICATIONS Generally, this is a safe procedure. However, problems may occur, including:  Infection.  Bleeding.  Pain. BEFORE THE PROCEDURE  Ask your health care provider about:  Changing or stopping your regular medicines. This is especially important if you are taking diabetes medicines or blood thinners.  Taking medicines such as aspirin and ibuprofen. These medicines can thin your blood. Do not take these medicines before your procedure if your health care provider instructs you not to.  You may need to have a procedure to examine the inside of your colon with a scope (colonoscopy). Your health care provider may do this to make sure that there are no other causes for your bleeding or pain. PROCEDURE  Your health care provider will clean your rectal area with a rinsing solution.  A lubricating jelly may be placed into your rectum. The jelly may contain a medicine to numb the area (local anesthetic).  Your health care provider will insert a short scope (anoscope) into your rectum to examine the hemorrhoids.  One of the following techniques will be used. Rubber Band Ligation Your health care provider will place medical instruments through the scope to put rubber bands around the base of your hemorrhoids. The bands will  cut off the blood supply to the hemorrhoids. The hemorrhoids will fall off after several days. Sclerotherapy Your health care provider will inject medicine through the scope into your hemorrhoids. This will cause them to shrink and dry up. Infrared Coagulation Your health care provider will shine a type of light through the scope onto your hemorrhoids. This light will generate energy (infrared radiation). It will cause the hemorrhoids to scar and then fall off. Each of these procedures may vary among health care providers and hospitals. AFTER THE PROCEDURE  You will be monitored to make sure that you have no bleeding.  Return to your normal activities as told by your health care provider.   This information is not intended to replace advice given to you by your health care provider. Make sure you discuss any questions you have with your health care provider.   Document Released: 10/13/2009 Document Revised: 09/06/2015 Document Reviewed: 03/13/2015 Elsevier Interactive Patient Education [employer].  Nonsurgical Procedures for Hemorrhoids Nonsurgical procedures can be used to treat hemorrhoids. Hemorrhoids are swollen veins that are inside the rectum (internal hemorrhoids) or around the anus (external hemorrhoids). They are caused by increased pressure in the anal area. This pressure may result from straining to have a bowel movement (constipation), diarrhea, pregnancy, obesity, anal sex, or sitting for long periods of time. Hemorrhoids can cause symptoms such as pain and bleeding. Various procedures may be performed if diet changes, lifestyle changes, and other treatments do not help your symptoms. Some of these procedures do not involve surgery. Three common nonsurgical procedures are:  Rubber band ligation. Rubber bands are used to cut off the blood supply to the hemorrhoids.  Sclerotherapy. Medicine is injected into the hemorrhoids to shrink them.  Infrared coagulation. A type of  light energy is used to get rid of the hemorrhoids. LET [facility] CARE PROVIDER KNOW ABOUT: 120. Any allergies you have. 121. All medicines you are taking, including vitamins, herbs, eye drops, creams, and over-the-counter medicines. 122. Previous problems you or members of your family have had with the use of anesthetics. 123. Any blood disorders you have. 124. Previous surgeries you have had. 125. Any medical conditions you have. 126. Whether you are pregnant or may be pregnant. RISKS AND COMPLICATIONS Generally, this is a safe procedure. However, problems may occur, including:  Infection.  Bleeding.  Pain. BEFORE THE PROCEDURE  Ask your health care provider about:  Changing or stopping your regular medicines. This is especially important if you are taking diabetes medicines or blood thinners.  Taking medicines such as aspirin and ibuprofen. These medicines can thin your blood. Do not take these medicines before your procedure if your health care provider instructs you not to.  You may need to have a procedure to examine the inside of your colon with a scope (colonoscopy). Your health care provider may do this to make sure that there are no other causes for your bleeding or pain. PROCEDURE  Your health care provider will clean your rectal area with a rinsing solution.  A lubricating jelly may be placed into your rectum. The jelly may contain a medicine to numb the area (local anesthetic).  Your health care provider will insert a short scope (anoscope) into your rectum to examine the hemorrhoids.  One of the following techniques will be used. Rubber Band Ligation Your health care provider will place medical instruments through the scope to put rubber bands around the base of your hemorrhoids. The bands will cut off the blood supply to the hemorrhoids. The hemorrhoids will fall off after several days. Sclerotherapy Your health care provider will inject medicine through the  scope into your hemorrhoids. This will cause them to shrink and dry up. Infrared Coagulation Your health care provider will shine a type of light through the scope onto your hemorrhoids. This light will generate energy (infrared radiation). It will cause the hemorrhoids to scar and then fall off. Each of these procedures may vary among health care providers and hospitals. AFTER THE PROCEDURE  You will be monitored to make sure that you have no bleeding.  Return to your normal activities as told by your health care provider.   This information is not intended to replace advice given to you by your health care provider. Make sure you discuss any questions you have with your health care provider.   Document Released: 10/13/2009 Document Revised: 09/06/2015 Document Reviewed: 03/13/2015 Elsevier Interactive Patient Education 2016 Else[employer] Inc. Nonsurgical Procedures for Hemorrhoids  Nonsurgical procedures can be used to treat hemorrhoids. Hemorrhoids are swollen veins that are inside the rectum (internal hemorrhoids) or around the anus (external hemorrhoids). They are caused by increased pressure in the anal area. This pressure may result from straining to have a bowel movement (constipation), diarrhea, pregnancy, obesity, anal sex, or sitting for long periods of time. Hemorrhoids can cause symptoms such as pain and bleeding. Various procedures may be performed if diet changes, lifestyle changes, and other treatments do not help your symptoms. Some of these procedures do not involve surgery. Three common nonsurgical procedures are:  Rubber band ligation. Rubber bands are used to cut off the blood supply to the hemorrhoids.  Sclerotherapy. Medicine is injected into the hemorrhoids to shrink them.  Infrared coagulation. A type of light energy is used to get rid of the hemorrhoids. LET [facility] CARE PROVIDER KNOW ABOUT: 127. Any allergies you have. 128. All medicines you are taking, including  vitamins, herbs, eye drops, creams, and over-the-counter medicines. 129. Previous problems you or members of your family have had with the use of anesthetics. 130. Any blood disorders you have. 131. Previous surgeries you have had. 132. Any medical conditions you have. 133. Whether you are pregnant or may be pregnant. RISKS AND COMPLICATIONS Generally, this is a safe procedure. However, problems may occur, including:  Infection.  Bleeding.  Pain. BEFORE THE PROCEDURE  Ask your health care provider about:  Changing or stopping your regular medicines. This is especially important if you are taking diabetes medicines or blood thinners.  Taking medicines such as aspirin and ibuprofen. These medicines can thin your blood. Do not take these medicines before your procedure if your health care provider instructs you not to.  You may need to have a procedure to examine the inside of your colon with a scope (colonoscopy). Your health care provider may do this to make sure that there are no other causes for your bleeding or pain. PROCEDURE  Your health care provider will clean your rectal area with a rinsing solution.  A lubricating jelly may be placed into your rectum. The jelly may contain a medicine to numb the area (local anesthetic).  Your health care provider will insert a short scope (anoscope) into your rectum to examine the hemorrhoids.  One of the following techniques will be used. Rubber Band Ligation Your health care provider will place medical instruments through the scope to put rubber bands around the base of your hemorrhoids. The bands will cut off the blood supply to the hemorrhoids. The hemorrhoids will fall off after several days. Sclerotherapy Your health care provider will inject medicine through the scope into your hemorrhoids. This will cause them to shrink and dry up. Infrared Coagulation Your health care provider will shine a type of light through the scope onto  your hemorrhoids. This light will generate energy (infrared radiation). It will cause the hemorrhoids to scar and then fall off. Each of these procedures may vary among health care providers and hospitals. AFTER THE PROCEDURE  You will be monitored to make sure that you have no bleeding.  Return to your normal activities as told by your health care provider.   This information is not intended to replace advice given to you by your health care provider. Make sure you discuss any questions you have with your health care provider.   Document Released: 10/13/2009 Document Revised: 09/06/2015 Document Reviewed: 03/13/2015 Elsevier Interactive Patient Education 2016 Elsevier Inc. Nonsurgical Procedures for Hemorrhoids Nonsurgical procedures can be  used to treat hemorrhoids. Hemorrhoids are swollen veins that are inside the rectum (internal hemorrhoids) or around the anus (external hemorrhoids). They are caused by increased pressure in the anal area. This pressure may result from straining to have a bowel movement (constipation), diarrhea, pregnancy, obesity, anal sex, or sitting for long periods of time. Hemorrhoids can cause symptoms such as pain and bleeding. Various procedures may be performed if diet changes, lifestyle changes, and other treatments do not help your symptoms. Some of these procedures do not involve surgery. Three common nonsurgical procedures are:  Rubber band ligation. Rubber bands are used to cut off the blood supply to the hemorrhoids.  Sclerotherapy. Medicine is injected into the hemorrhoids to shrink them.  Infrared coagulation. A type of light energy is used to get rid of the hemorrhoids. LET [facility] CARE PROVIDER KNOW ABOUT: 134. Any allergies you have. 135. All medicines you are taking, including vitamins, herbs, eye drops, creams, and over-the-counter medicines. 136. Previous problems you or members of your family have had with the use of anesthetics. 137. Any  blood disorders you have. 138. Previous surgeries you have had. 139. Any medical conditions you have. 140. Whether you are pregnant or may be pregnant. RISKS AND COMPLICATIONS Generally, this is a safe procedure. However, problems may occur, including:  Infection.  Bleeding.  Pain. BEFORE THE PROCEDURE  Ask your health care provider about:  Changing or stopping your regular medicines. This is especially important if you are taking diabetes medicines or blood thinners.  Taking medicines such as aspirin and ibuprofen. These medicines can thin your blood. Do not take these medicines before your procedure if your health care provider instructs you not to.  You may need to have a procedure to examine the inside of your colon with a scope (colonoscopy). Your health care provider may do this to make sure that there are no other causes for your bleeding or pain. PROCEDURE  Your health care provider will clean your rectal area with a rinsing solution.  A lubricating jelly may be placed into your rectum. The jelly may contain a medicine to numb the area (local anesthetic).  Your health care provider will insert a short scope (anoscope) into your rectum to examine the hemorrhoids.  One of the following techniques will be used. Rubber Band Ligation Your health care provider will place medical instruments through the scope to put rubber bands around the base of your hemorrhoids. The bands will cut off the blood supply to the hemorrhoids. The hemorrhoids will fall off after several days. Sclerotherapy Your health care provider will inject medicine through the scope into your hemorrhoids. This will cause them to shrink and dry up. Infrared Coagulation Your health care provider will shine a type of light through the scope onto your hemorrhoids. This light will generate energy (infrared radiation). It will cause the hemorrhoids to scar and then fall off. Each of these procedures may vary among  health care providers and hospitals. AFTER THE PROCEDURE  You will be monitored to make sure that you have no bleeding.  Return to your normal activities as told by your health care provider.   This information is not intended to replace advice given to you by your health care provider. Make sure you discuss any questions you have with your health care provider.   Document Released: 10/13/2009 Document Revised: 09/06/2015 Document Reviewed: 03/13/2015 Elsevier Interactive Patient Education 2016 [employer] Nonsurgical Procedures for Hemorrhoids Nonsurgical procedures can be used to treat hemorrhoids.  Hemorrhoids are swollen veins that are inside the rectum (internal hemorrhoids) or around the anus (external hemorrhoids). They are caused by increased pressure in the anal area. This pressure may result from straining to have a bowel movement (constipation), diarrhea, pregnancy, obesity, anal sex, or sitting for long periods of time. Hemorrhoids can cause symptoms such as pain and bleeding. Various procedures may be performed if diet changes, lifestyle changes, and other treatments do not help your symptoms. Some of these procedures do not involve surgery. Three common nonsurgical procedures are:  Rubber band ligation. Rubber bands are used to cut off the blood supply to the hemorrhoids.  Sclerotherapy. Medicine is injected into the hemorrhoids to shrink them.  Infrared coagulation. A type of light energy is used to get rid of the hemorrhoids. LET [facility] CARE PROVIDER KNOW ABOUT: 141. Any allergies you have. 142. All medicines you are taking, including vitamins, herbs, eye drops, creams, and over-the-counter medicines. 143. Previous problems you or members of your family have had with the use of anesthetics. 144. Any blood disorders you have. 145. Previous surgeries you have had. 146. Any medical conditions you have. 147. Whether you are pregnant or may be pregnant. RISKS AND  COMPLICATIONS Generally, this is a safe procedure. However, problems may occur, including:  Infection.  Bleeding.  Pain. BEFORE THE PROCEDURE  Ask your health care provider about:  Changing or stopping your regular medicines. This is especially important if you are taking diabetes medicines or blood thinners.  Taking medicines such as aspirin and ibuprofen. These medicines can thin your blood. Do not take these medicines before your procedure if your health care provider instructs you not to.  You may need to have a procedure to examine the inside of your colon with a scope (colonoscopy). Your health care provider may do this to make sure that there are no other causes for your bleeding or pain. PROCEDURE  Your health care provider will clean your rectal area with a rinsing solution.  A lubricating jelly may be placed into your rectum. The jelly may contain a medicine to numb the area (local anesthetic).  Your health care provider will insert a short scope (anoscope) into your rectum to examine the hemorrhoids.  One of the following techniques will be used. Rubber Band Ligation Your health care provider will place medical instruments through the scope to put rubber bands around the base of your hemorrhoids. The bands will cut off the blood supply to the hemorrhoids. The hemorrhoids will fall off after several days. Sclerotherapy Your health care provider will inject medicine through the scope into your hemorrhoids. This will cause them to shrink and dry up. Infrared Coagulation Your health care provider will shine a type of light through the scope onto your hemorrhoids. This light will generate energy (infrared radiation). It will cause the hemorrhoids to scar and then fall off. Each of these procedures may vary among health care providers and hospitals. AFTER THE PROCEDURE  You will be monitored to make sure that you have no bleeding.  Return to your normal activities as told by  your health care provider.   This information is not intended to replace advice given to you by your health care provider. Make sure you discuss any questions you have with your health care provider.   Document Released: 10/13/2009 Document Revised: 09/06/2015 Document Reviewed: 03/13/2015 Elsevier Interactive Patient Education 2016 [employer] Nonsurgical Procedures for Hemorrhoids Nonsurgical procedures can be used to treat hemorrhoids. Hemorrhoids are swollen veins  that are inside the rectum (internal hemorrhoids) or around the anus (external hemorrhoids). They are caused by increased pressure in the anal area. This pressure may result from straining to have a bowel movement (constipation), diarrhea, pregnancy, obesity, anal sex, or sitting for long periods of time. Hemorrhoids can cause symptoms such as pain and bleeding. Various procedures may be performed if diet changes, lifestyle changes, and other treatments do not help your symptoms. Some of these procedures do not involve surgery. Three common nonsurgical procedures are:  Rubber band ligation. Rubber bands are used to cut off the blood supply to the hemorrhoids.  Sclerotherapy. Medicine is injected into the hemorrhoids to shrink them.  Infrared coagulation. A type of light energy is used to get rid of the hemorrhoids. LET [facility] CARE PROVIDER KNOW ABOUT: 148. Any allergies you have. 149. All medicines you are taking, including vitamins, herbs, eye drops, creams, and over-the-counter medicines. 150. Previous problems you or members of your family have had with the use of anesthetics. 151. Any blood disorders you have. 152. Previous surgeries you have had. 153. Any medical conditions you have. 154. Whether you are pregnant or may be pregnant. RISKS AND COMPLICATIONS Generally, this is a safe procedure. However, problems may occur, including:  Infection.  Bleeding.  Pain. BEFORE THE PROCEDURE  Ask your health care  provider about:  Changing or stopping your regular medicines. This is especially important if you are taking diabetes medicines or blood thinners.  Taking medicines such as aspirin and ibuprofen. These medicines can thin your blood. Do not take these medicines before your procedure if your health care provider instructs you not to.  You may need to have a procedure to examine the inside of your colon with a scope (colonoscopy). Your health care provider may do this to make sure that there are no other causes for your bleeding or pain. PROCEDURE  Your health care provider will clean your rectal area with a rinsing solution.  A lubricating jelly may be placed into your rectum. The jelly may contain a medicine to numb the area (local anesthetic).  Your health care provider will insert a short scope (anoscope) into your rectum to examine the hemorrhoids.  One of the following techniques will be used. Rubber Band Ligation Your health care provider will place medical instruments through the scope to put rubber bands around the base of your hemorrhoids. The bands will cut off the blood supply to the hemorrhoids. The hemorrhoids will fall off after several days. Sclerotherapy Your health care provider will inject medicine through the scope into your hemorrhoids. This will cause them to shrink and dry up. Infrared Coagulation Your health care provider will shine a type of light through the scope onto your hemorrhoids. This light will generate energy (infrared radiation). It will cause the hemorrhoids to scar and then fall off. Each of these procedures may vary among health care providers and hospitals. AFTER THE PROCEDURE  You will be monitored to make sure that you have no bleeding.  Return to your normal activities as told by your health care provider.   This information is not intended to replace advice given to you by your health care provider. Make sure you discuss any questions you have  with your health care provider.   Document Released: 10/13/2009 Document Revised: 09/06/2015 Document Reviewed: 03/13/2015 Elsevier Interactive Patient Education 2016 [employer] Nonsurgical Procedures for Hemorrhoids Nonsurgical procedures can be used to treat hemorrhoids. Hemorrhoids are swollen veins that are inside the  rectum (internal hemorrhoids) or around the anus (external hemorrhoids). They are caused by increased pressure in the anal area. This pressure may result from straining to have a bowel movement (constipation), diarrhea, pregnancy, obesity, anal sex, or sitting for long periods of time. Hemorrhoids can cause symptoms such as pain and bleeding. Various procedures may be performed if diet changes, lifestyle changes, and other treatments do not help your symptoms. Some of these procedures do not involve surgery. Three common nonsurgical procedures are:  Rubber band ligation. Rubber bands are used to cut off the blood supply to the hemorrhoids.  Sclerotherapy. Medicine is injected into the hemorrhoids to shrink them.  Infrared coagulation. A type of light energy is used to get rid of the hemorrhoids. LET [facility] CARE PROVIDER KNOW ABOUT: 155. Any allergies you have. 156. All medicines you are taking, including vitamins, herbs, eye drops, creams, and over-the-counter medicines. 157. Previous problems you or members of your family have had with the use of anesthetics. 158. Any blood disorders you have. 159. Previous surgeries you have had. 160. Any medical conditions you have. 161. Whether you are pregnant or may be pregnant. RISKS AND COMPLICATIONS Generally, this is a safe procedure. However, problems may occur, including:  Infection.  Bleeding.  Pain. BEFORE THE PROCEDURE  Ask your health care provider about:  Changing or stopping your regular medicines. This is especially important if you are taking diabetes medicines or blood thinners.  Taking medicines such  as aspirin and ibuprofen. These medicines can thin your blood. Do not take these medicines before your procedure if your health care provider instructs you not to.  You may need to have a procedure to examine the inside of your colon with a scope (colonoscopy). Your health care provider may do this to make sure that there are no other causes for your bleeding or pain. PROCEDURE  Your health care provider will clean your rectal area with a rinsing solution.  A lubricating jelly may be placed into your rectum. The jelly may contain a medicine to numb the area (local anesthetic).  Your health care provider will insert a short scope (anoscope) into your rectum to examine the hemorrhoids.  One of the following techniques will be used. Rubber Band Ligation Your health care provider will place medical instruments through the scope to put rubber bands around the base of your hemorrhoids. The bands will cut off the blood supply to the hemorrhoids. The hemorrhoids will fall off after several days. Sclerotherapy Your health care provider will inject medicine through the scope into your hemorrhoids. This will cause them to shrink and dry up. Infrared Coagulation Your health care provider will shine a type of light through the scope onto your hemorrhoids. This light will generate energy (infrared radiation). It will cause the hemorrhoids to scar and then fall off. Each of these procedures may vary among health care providers and hospitals. AFTER THE PROCEDURE  You will be monitored to make sure that you have no bleeding.  Return to your normal activities as told by your health care provider.   This information is not intended to replace advice given to you by your health care provider. Make sure you discuss any questions you have with your health care provider.   Document Released: 10/13/2009 Document Revised: 09/06/2015 Document Reviewed: 03/13/2015 Elsevier Interactive Patient Education 2016  [employer] Nonsurgical Procedures for Hemorrhoids Nonsurgical procedures can be used to treat hemorrhoids. Hemorrhoids are swollen veins that are inside the rectum (internal hemorrhoids) or  around the anus (external hemorrhoids). They are caused by increased pressure in the anal area. This pressure may result from straining to have a bowel movement (constipation), diarrhea, pregnancy, obesity, anal sex, or sitting for long periods of time. Hemorrhoids can cause symptoms such as pain and bleeding. Various procedures may be performed if diet changes, lifestyle changes, and other treatments do not help your symptoms. Some of these procedures do not involve surgery. Three common nonsurgical procedures are:  Rubber band ligation. Rubber bands are used to cut off the blood supply to the hemorrhoids.  Sclerotherapy. Medicine is injected into the hemorrhoids to shrink them.  Infrared coagulation. A type of light energy is used to get rid of the hemorrhoids. LET [facility] CARE PROVIDER KNOW ABOUT: 162. Any allergies you have. 163. All medicines you are taking, including vitamins, herbs, eye drops, creams, and over-the-counter medicines. 164. Previous problems you or members of your family have had with the use of anesthetics. 165. Any blood disorders you have. 166. Previous surgeries you have had. 167. Any medical conditions you have. 168. Whether you are pregnant or may be pregnant. RISKS AND COMPLICATIONS Generally, this is a safe procedure. However, problems may occur, including:  Infection.  Bleeding.  Pain. BEFORE THE PROCEDURE  Ask your health care provider about:  Changing or stopping your regular medicines. This is especially important if you are taking diabetes medicines or blood thinners.  Taking medicines such as aspirin and ibuprofen. These medicines can thin your blood. Do not take these medicines before your procedure if your health care provider instructs you not to.  You  may need to have a procedure to examine the inside of your colon with a scope (colonoscopy). Your health care provider may do this to make sure that there are no other causes for your bleeding or pain. PROCEDURE  Your health care provider will clean your rectal area with a rinsing solution.  A lubricating jelly may be placed into your rectum. The jelly may contain a medicine to numb the area (local anesthetic).  Your health care provider will insert a short scope (anoscope) into your rectum to examine the hemorrhoids.  One of the following techniques will be used. Rubber Band Ligation Your health care provider will place medical instruments through the scope to put rubber bands around the base of your hemorrhoids. The bands will cut off the blood supply to the hemorrhoids. The hemorrhoids will fall off after several days. Sclerotherapy Your health care provider will inject medicine through the scope into your hemorrhoids. This will cause them to shrink and dry up. Infrared Coagulation Your health care provider will shine a type of light through the scope onto your hemorrhoids. This light will generate energy (infrared radiation). It will cause the hemorrhoids to scar and then fall off. Each of these procedures may vary among health care providers and hospitals. AFTER THE PROCEDURE  You will be monitored to make sure that you have no bleeding.  Return to your normal activities as told by your health care provider.   This information is not intended to replace advice given to you by your health care provider. Make sure you discuss any questions you have with your health care provider.   Document Released: 10/13/2009 Document Revised: 09/06/2015 Document Reviewed: 03/13/2015 Elsevier Interactive Patient Education 2016 [employer] Inc. Nonsurgical Procedures for Hemorrhoids Nonsurgical procedures can be used to treat hemorrhoids. Hemorrhoids are swollen veins that are inside the rectum  (internal hemorrhoids) or around the anus (external  hemorrhoids). They are caused by increased pressure in the anal area. This pressure may result from straining to have a bowel movement (constipation), diarrhea, pregnancy, obesity, anal sex, or sitting for long periods of time. Hemorrhoids can cause symptoms such as pain and bleeding. Various procedures may be performed if diet changes, lifestyle changes, and other treatments do not help your symptoms. Some of these procedures do not involve surgery. Three common nonsurgical procedures are:  Rubber band ligation. Rubber bands are used to cut off the blood supply to the hemorrhoids.  Sclerotherapy. Medicine is injected into the hemorrhoids to shrink them.  Infrared coagulation. A type of light energy is used to get rid of the hemorrhoids. LET YOUR HEALTH CARE PROVIDER KNOW ABOUT: 169. Any allergies you have. 170. All medicines you are taking, including vitamins, herbs, eye drops, creams, and over-the-counter medicines. 171. Previous problems you or members of your family have had with the use of anesthetics. 172. Any blood disorders you have. 173. Previous surgeries you have had. 174. Any medical conditions you have. 175. Whether you are pregnant or may be pregnant. RISKS AND COMPLICATIONS Generally, this is a safe procedure. However, problems may occur, including:  Infection.  Bleeding.  Pain. BEFORE THE PROCEDURE  Ask your health care provider about:  Changing or stopping your regular medicines. This is especially important if you are taking diabetes medicines or blood thinners.  Taking medicines such as aspirin and ibuprofen. These medicines can thin your blood. Do not take these medicines before your procedure if your health care provider instructs you not to.  You may need to have a procedure to examine the inside of your colon with a scope (colonoscopy). Your health care provider may do this to make sure that there are no other  causes for your bleeding or pain. PROCEDURE  Your health care provider will clean your rectal area with a rinsing solution.  A lubricating jelly may be placed into your rectum. The jelly may contain a medicine to numb the area (local anesthetic).  Your health care provider will insert a short scope (anoscope) into your rectum to examine the hemorrhoids.  One of the following techniques will be used. Rubber Band Ligation Your health care provider will place medical instruments through the scope to put rubber bands around the base of your hemorrhoids. The bands will cut off the blood supply to the hemorrhoids. The hemorrhoids will fall off after several days. Sclerotherapy Your health care provider will inject medicine through the scope into your hemorrhoids. This will cause them to shrink and dry up. Infrared Coagulation Your health care provider will shine a type of light through the scope onto your hemorrhoids. This light will generate energy (infrared radiation). It will cause the hemorrhoids to scar and then fall off. Each of these procedures may vary among health care providers and hospitals. AFTER THE PROCEDURE  You will be monitored to make sure that you have no bleeding.  Return to your normal activities as told by your health care provider.   This information is not intended to replace advice given to you by your health care provider. Make sure you discuss any questions you have with your health care provider.   Document Released: 10/13/2009 Document Revised: 09/06/2015 Document Reviewed: 03/13/2015 Elsevier Interactive Patient Education 2016 [employer] Nonsurgical Procedures for Hemorrhoids Nonsurgical procedures can be used to treat hemorrhoids. Hemorrhoids are swollen veins that are inside the rectum (internal hemorrhoids) or around the anus (external hemorrhoids). They are caused by  increased pressure in the anal area. This pressure may result from straining to have a  bowel movement (constipation), diarrhea, pregnancy, obesity, anal sex, or sitting for long periods of time. Hemorrhoids can cause symptoms such as pain and bleeding. Various procedures may be performed if diet changes, lifestyle changes, and other treatments do not help your symptoms. Some of these procedures do not involve surgery. Three common nonsurgical procedures are:  Rubber band ligation. Rubber bands are used to cut off the blood supply to the hemorrhoids.  Sclerotherapy. Medicine is injected into the hemorrhoids to shrink them.  Infrared coagulation. A type of light energy is used to get rid of the hemorrhoids. LET [facility] CARE PROVIDER KNOW ABOUT: 176. Any allergies you have. 177. All medicines you are taking, including vitamins, herbs, eye drops, creams, and over-the-counter medicines. 178. Previous problems you or members of your family have had with the use of anesthetics. 179. Any blood disorders you have. 180. Previous surgeries you have had. 181. Any medical conditions you have. 182. Whether you are pregnant or may be pregnant. RISKS AND COMPLICATIONS Generally, this is a safe procedure. However, problems may occur, including:  Infection.  Bleeding.  Pain. BEFORE THE PROCEDURE  Ask your health care provider about:  Changing or stopping your regular medicines. This is especially important if you are taking diabetes medicines or blood thinners.  Taking medicines such as aspirin and ibuprofen. These medicines can thin your blood. Do not take these medicines before your procedure if your health care provider instructs you not to.  You may need to have a procedure to examine the inside of your colon with a scope (colonoscopy). Your health care provider may do this to make sure that there are no other causes for your bleeding or pain. PROCEDURE  Your health care provider will clean your rectal area with a rinsing solution.  A lubricating jelly may be placed into  your rectum. The jelly may contain a medicine to numb the area (local anesthetic).  Your health care provider will insert a short scope (anoscope) into your rectum to examine the hemorrhoids.  One of the following techniques will be used. Rubber Band Ligation Your health care provider will place medical instruments through the scope to put rubber bands around the base of your hemorrhoids. The bands will cut off the blood supply to the hemorrhoids. The hemorrhoids will fall off after several days. Sclerotherapy Your health care provider will inject medicine through the scope into your hemorrhoids. This will cause them to shrink and dry up. Infrared Coagulation Your health care provider will shine a type of light through the scope onto your hemorrhoids. This light will generate energy (infrared radiation). It will cause the hemorrhoids to scar and then fall off. Each of these procedures may vary among health care providers and hospitals. AFTER THE PROCEDURE  You will be monitored to make sure that you have no bleeding.  Return to your normal activities as told by your health care provider.   This information is not intended to replace advice given to you by your health care provider. Make sure you discuss any questions you have with your health care provider.   Document Released: 10/13/2009 Document Revised: 09/06/2015 Document Reviewed: 03/13/2015 Elsevier Interactive Patient Education 2016 [employer]. How to Take a [employer] A sitz bath is a warm water bath that is taken while you are sitting down. The water should only come up to your hips and should cover your buttocks. Your  health care provider may recommend a sitz bath to help you:   Clean the lower part of your body, including your genital area.  With itching.  With pain.  With sore muscles or muscles that tighten or spasm. HOW TO TAKE A SITZ BATH Take 3-4 sitz baths per day or as told by your health care  provider. 183. Partially fill a bathtub with warm water. You will only need the water to be deep enough to cover your hips and buttocks when you are sitting in it. 184. If your health care provider told you to put medicine in the water, follow the directions exactly. 185. Sit in the water and open the tub drain a little. 186. Turn on the warm water again to keep the tub at the correct level. Keep the water running constantly. 187. Soak in the water for 15-20 minutes or as told by your health care provider. 188. After the sitz bath, pat the affected area dry first. Do not rub it. 189. Be careful when you stand up after the sitz bath because you may feel dizzy. SEEK MEDICAL CARE IF:  Your symptoms get worse. Do not continue with sitz baths if your symptoms get worse.  You have new symptoms. Do not continue with sitz baths until you talk with your health care provider.   This information is not intended to replace advice given to you by your health care provider. Make sure you discuss any questions you have with your health care provider.   Document Released: 09/07/2004 Document Revised: 05/02/2015 Document Reviewed: 12/14/2014 Elsevier Interactive Patient Education 2016 [employer]. How to Take a [employer] A sitz bath is a warm water bath that is taken while you are sitting down. The water should only come up to your hips and should cover your buttocks. Your health care provider may recommend a sitz bath to help you:   Clean the lower part of your body, including your genital area.  With itching.  With pain.  With sore muscles or muscles that tighten or spasm. HOW TO TAKE A SITZ BATH Take 3-4 sitz baths per day or as told by your health care provider. 190. Partially fill a bathtub with warm water. You will only need the water to be deep enough to cover your hips and buttocks when you are sitting in it. 191. If your health care provider told you to put medicine in the water, follow the  directions exactly. 192. Sit in the water and open the tub drain a little. 193. Turn on the warm water again to keep the tub at the correct level. Keep the water running constantly. 194. Soak in the water for 15-20 minutes or as told by your health care provider. 195. After the sitz bath, pat the affected area dry first. Do not rub it. 196. Be careful when you stand up after the sitz bath because you may feel dizzy. SEEK MEDICAL CARE IF:  Your symptoms get worse. Do not continue with sitz baths if your symptoms get worse.  You have new symptoms. Do not continue with sitz baths until you talk with your health care provider.   This information is not intended to replace advice given to you by your health care provider. Make sure you discuss any questions you have with your health care provider.   Document Released: 09/07/2004 Document Revised: 05/02/2015 Document Reviewed: 12/14/2014 Elsevier Interactive Patient Education 2016 [employer]. How to Take a [employer] A sitz bath is a  warm water bath that is taken while you are sitting down. The water should only come up to your hips and should cover your buttocks. Your health care provider may recommend a sitz bath to help you:   Clean the lower part of your body, including your genital area.  With itching.  With pain.  With sore muscles or muscles that tighten or spasm. HOW TO TAKE A SITZ BATH Take 3-4 sitz baths per day or as told by your health care provider. 197. Partially fill a bathtub with warm water. You will only need the water to be deep enough to cover your hips and buttocks when you are sitting in it. 198. If your health care provider told you to put medicine in the water, follow the directions exactly. 199. Sit in the water and open the tub drain a little. 200. Turn on the warm water again to keep the tub at the correct level. Keep the water running constantly. 201. Soak in the water for 15-20 minutes or as told by your  health care provider. 202. After the sitz bath, pat the affected area dry first. Do not rub it. 203. Be careful when you stand up after the sitz bath because you may feel dizzy. SEEK MEDICAL CARE IF:  Your symptoms get worse. Do not continue with sitz baths if your symptoms get worse.  You have new symptoms. Do not continue with sitz baths until you talk with your health care provider.   This information is not intended to replace advice given to you by your health care provider. Make sure you discuss any questions you have with your health care provider.   Document Released: 09/07/2004 Document Revised: 05/02/2015 Document Reviewed: 12/14/2014 Elsevier Interactive Patient Education [employer].

## 2017-01-06 ENCOUNTER — Ambulatory Visit (INDEPENDENT_AMBULATORY_CARE_PROVIDER_SITE_OTHER): Payer: Managed Care, Other (non HMO) | Admitting: Internal Medicine

## 2017-01-06 ENCOUNTER — Encounter: Payer: Self-pay | Admitting: Internal Medicine

## 2017-01-06 DIAGNOSIS — F410 Panic disorder [episodic paroxysmal anxiety] without agoraphobia: Secondary | ICD-10-CM

## 2017-01-06 DIAGNOSIS — F419 Anxiety disorder, unspecified: Secondary | ICD-10-CM | POA: Insufficient documentation

## 2017-01-06 NOTE — Patient Instructions (Signed)
Call or return to clinic prn if these symptoms worsen or fail to improve as anticipated.

## 2017-01-06 NOTE — Progress Notes (Signed)
Subjective:    Patient ID: Luis Delgado, male    DOB: November 18, 1966, 51 y.o.   MRN: [id number]  HPI  51 year old patient who is seen today for follow-up.  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 Doing quite well today.  He is seen last summer with an inflamed hemorrhoid that has not been much of a problem. No major concerns or complaints He does describe some occasional episodes of panic attacks. He was seen now since his last visit here to local hospital.  Due to chest tightness and shortness of breath.  No recurrent symptoms  Past Medical History:  Diagnosis Date  . Anxiety      Social History   Social History  . Marital status: Married    Spouse name: N/A  . Number of children: N/A  . Years of education: N/A   Occupational History  . Not on file.   Social History Main Topics  . Smoking status: Never Smoker  . Smokeless tobacco: Never Used  . Alcohol use No  . Drug use: No  . Sexual activity: Not on file   Other Topics Concern  . Not on file   Social History Narrative  . No narrative on file    Past Surgical History:  Procedure Laterality Date  . TONSILLECTOMY    . VASECTOMY    . VASECTOMY REVERSAL      No family history on file.  No Known Allergies  Current Outpatient Prescriptions on File Prior to Visit  Medication Sig Dispense Refill  . Multiple Vitamins-Minerals (MENS MULTIPLUS PO) Take by mouth.     No current facility-administered medications on file prior to visit.     BP 138/76 (BP Location: Left Arm, Patient Position: Sitting, Cuff Size: Normal)   Pulse (!) 58   Temp 97.7 F (36.5 C) (Oral)   Ht 5\' 11"  (1.803 m)   Wt 216 lb 12.8 oz (98.3 kg)   SpO2 98%   BMI 30.24 kg/m     Review of Systems  Constitutional: Negative for appetite change, chills, fatigue and fever.  HENT: Negative for congestion, dental problem, ear pain, hearing loss, sore throat, tinnitus, trouble swallowing and voice change.   Eyes:  Negative for pain, discharge and visual disturbance.  Respiratory: Negative for cough, chest tightness, wheezing and stridor.   Cardiovascular: Negative for chest pain, palpitations and leg swelling.  Gastrointestinal: Negative for abdominal distention, abdominal pain, blood in stool, constipation, diarrhea, nausea and vomiting.  Genitourinary: Negative for difficulty urinating, discharge, flank pain, genital sores, hematuria and urgency.  Musculoskeletal: Negative for arthralgias, back pain, gait problem, joint swelling, myalgias and neck stiffness.  Skin: Negative for rash.  Neurological: Negative for dizziness, syncope, speech difficulty, weakness, numbness and headaches.  Hematological: Negative for adenopathy. Does not bruise/bleed easily.  Psychiatric/Behavioral: Negative for behavioral problems and dysphoric mood. The patient is nervous/anxious.        Objective:   Physical Exam  Constitutional: He is oriented to person, place, and time. He appears well-developed.  HENT:  Head: Normocephalic.  Right Ear: External ear normal.  Left Ear: External ear normal.  Eyes: Conjunctivae and EOM are normal.  Neck: Normal range of motion.  Cardiovascular: Normal rate and normal heart sounds.   Pulmonary/Chest: Breath sounds normal.  Abdominal: Bowel sounds are normal.  Genitourinary: Prostate normal. Rectal exam shows guaiac negative stool.  Genitourinary Comments: No hemorrhoidal disease appreciated today  Musculoskeletal: Normal range of motion. He exhibits no  edema or tenderness.  Neurological: He is alert and oriented to person, place, and time.  Psychiatric: He has a normal mood and affect. His behavior is normal.          Assessment & Plan:   Preventive health History of inflamed external hemorrhoid, resolved Panic disorder.  Mild and stable.  Medications such as alprazolam is available OTC in [place].  He will consider purchasing a small supply to have on hand  CPX at his  convenience with updated lab  Nyoka Cowden

## 2017-01-06 NOTE — Progress Notes (Signed)
Pre visit review using our clinic review tool, if applicable. No additional management support is needed unless otherwise documented below in the visit note. 

## 2017-04-22 ENCOUNTER — Encounter: Payer: Managed Care, Other (non HMO) | Admitting: Internal Medicine

## 2017-10-30 ENCOUNTER — Encounter (HOSPITAL_COMMUNITY): Payer: Self-pay

## 2017-10-30 ENCOUNTER — Emergency Department (HOSPITAL_COMMUNITY): Payer: Managed Care, Other (non HMO)

## 2017-10-30 ENCOUNTER — Emergency Department (HOSPITAL_COMMUNITY)
Admission: EM | Admit: 2017-10-30 | Discharge: 2017-10-30 | Disposition: A | Discharge status: Home / Self Care | Payer: Managed Care, Other (non HMO) | Attending: Emergency Medicine | Admitting: Emergency Medicine

## 2017-10-30 DIAGNOSIS — R42 Dizziness and giddiness: Secondary | ICD-10-CM | POA: Insufficient documentation

## 2017-10-30 DIAGNOSIS — R0789 Other chest pain: Secondary | ICD-10-CM

## 2017-10-30 DIAGNOSIS — R079 Chest pain, unspecified: Secondary | ICD-10-CM | POA: Diagnosis present

## 2017-10-30 LAB — BASIC METABOLIC PANEL
ANION GAP: 7 (ref 5–15)
BUN: 10 mg/dL (ref 6–20)
CHLORIDE: 105 mmol/L (ref 101–111)
CO2: 26 mmol/L (ref 22–32)
Calcium: 9.7 mg/dL (ref 8.9–10.3)
Creatinine, Ser: 0.95 mg/dL (ref 0.61–1.24)
GFR calc Af Amer: >60 mL/min (ref >60)
Glucose, Bld: 120 mg/dL — ABNORMAL HIGH (ref 65–99)
POTASSIUM: 4.1 mmol/L (ref 3.5–5.1)
SODIUM: 138 mmol/L (ref 135–145)

## 2017-10-30 LAB — I-STAT TROPONIN, ED
Troponin i, poc: 0 ng/mL (ref 0.00–0.08)
Troponin i, poc: 0 ng/mL (ref 0.00–0.08)

## 2017-10-30 LAB — CBC
HEMATOCRIT: 47 % (ref 39.0–52.0)
HEMOGLOBIN: 16.8 g/dL (ref 13.0–17.0)
MCH: 30.3 pg (ref 26.0–34.0)
MCHC: 35.7 g/dL (ref 30.0–36.0)
MCV: 84.8 fL (ref 78.0–100.0)
Platelets: 194 10*3/uL (ref 150–400)
RBC: 5.54 MIL/uL (ref 4.22–5.81)
RDW: 13.7 % (ref 11.5–15.5)
WBC: 6.9 10*3/uL (ref 4.0–10.5)

## 2017-10-30 LAB — D-DIMER, QUANTITATIVE (NOT AT ARMC)

## 2017-10-30 NOTE — ED Triage Notes (Signed)
Pt arrives To ED with complaints of waking up at 0300 feeling like he could not breathe with shooting chest pain on right side. Endorses dizziness and fatigue. Pt reports he has hx of same and it was anxiety related. Endorses he is under a lot of stress at the time and feels may be related. PT reports recent travel from [place] and is flying to [place]. Denies diaphoresis, n/v. No cardiac hx

## 2017-10-30 NOTE — Discharge Instructions (Signed)
Please follow up with your doctor for further evaluation.  Return if you have any concerns.

## 2017-10-30 NOTE — ED Notes (Signed)
Per main lab, will add on D-dimer.

## 2017-10-30 NOTE — ED Provider Notes (Signed)
[facility] EMERGENCY DEPARTMENT Provider Note   CSN: [id number] Arrival date & time: 10/30/17  1229     History   Chief Complaint Chief Complaint  Patient presents with  . Chest Pain  . Dizziness    HPI TARAN HAYNESWORTH is a 51 y.o. male.  HPI   51 year old male with history of anxiety presenting for evaluation of chest pain. Patient report at 3 AM this morning he was awoke with a pressure sensation to his chest with associated dizziness, and having shortness of breath. He got up, and stays up. 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 This is his usual activity. When he got back he feels more tired than usual, endorse dizziness and feeling like he was going to pass out. He also felt his heart racing. He took Lopressor to help with symptom but decided to come to the ER for further evaluation. He is currently treating his symptoms to anxiety and panic attack since he has had this in the past. He did report having increased stressor both with work and with extensive travels. He currently lives in [place], travel to the [place] 2 weeks ago, was involved in his daughter's wedding yesterday, and is scheduled to travel to [place] tomorrow. He also drove from [place] to [place] recently. He denies having any prior history of PE or DVT, denies leg swelling or calf pain. Does endorse some muscle aches but attributed to walking. He mentioned having similar chest pain recently 8 months ago in [place]. States that he had a heart catheterization and stress test and was told that he does not have any evidence of cardiac ischemia. He was told that his cholesterol level is slightly elevated but no specific treatment given. Does have family history of cardiac disease. He is a nonsmoker and denies alcohol abuse. He stays active.  Past Medical History:  Diagnosis Date  . Anxiety     Patient Active Problem List   Diagnosis Date Noted  . Panic disorder 01/06/2017    . FASCIITIS, [place] 09/18/2007    Past Surgical History:  Procedure Laterality Date  . TONSILLECTOMY    . VASECTOMY    . VASECTOMY REVERSAL         Home Medications    Prior to Admission medications   Medication Sig Start Date End Date Taking? Authorizing Provider  Multiple Vitamins-Minerals (MENS MULTIPLUS PO) Take by mouth.    [provider]    Family History No family history on file.  Social History Social History  Substance Use Topics  . Smoking status: Never Smoker  . Smokeless tobacco: Never Used  . Alcohol use No     Allergies   Patient has no known allergies.   Review of Systems Review of Systems  All other systems reviewed and are negative.    Physical Exam Updated Vital Signs BP 113/79 (BP Location: Left Arm)   Pulse 68   Temp 98.8 F (37.1 C) (Oral)   Resp 16   Ht 5\' 10"  (1.778 m)   Wt 95.3 kg (210 lb)   SpO2 100%   BMI 30.13 kg/m   Physical Exam  Constitutional: He is oriented to person, place, and time. He appears well-developed and well-nourished. No distress.  HENT:  Head: Atraumatic.  Eyes: Conjunctivae are normal.  Neck: Neck supple.  Cardiovascular: Normal rate, regular rhythm and intact distal pulses.   Pulmonary/Chest: Effort normal and breath sounds normal.  Abdominal: Soft. Bowel sounds  are normal. He exhibits no distension. There is no tenderness.  Musculoskeletal: He exhibits no edema.  Neurological: He is alert and oriented to person, place, and time.  Skin: No rash noted.  Psychiatric: He has a normal mood and affect.  Nursing note and vitals reviewed.    ED Treatments / Results  Labs (all labs ordered are listed, but only abnormal results are displayed) Labs Reviewed  BASIC METABOLIC PANEL - Abnormal; Notable for the following:       Result Value   Glucose, Bld 120 (*)    All other components within normal limits  CBC  I-STAT TROPONIN, ED    EKG  EKG Interpretation None        Radiology Dg Chest 2 View  Result Date: 10/30/2017 CLINICAL DATA:  Chest tightness associated with shortness of breath and dizziness beginning at 3 a.m. today. Nonsmoker. EXAM: CHEST  2 VIEW COMPARISON:  Chest x-ray of February 06, 2004 FINDINGS: The lungs are well-expanded. There is no focal infiltrate. There is no pleural effusion or pneumothorax. The heart and pulmonary vascularity are normal. The mediastinum is normal in width. The bony thorax is unremarkable. IMPRESSION: There is no active cardiopulmonary disease. Electronically Signed   By: David  Martinique M.D.   On: 10/30/2017 13:13    Procedures Procedures (including critical care time)  Medications Ordered in ED Medications - No data to display   Initial Impression / Assessment and Plan / ED Course  I have reviewed the triage vital signs and the nursing notes.  Pertinent labs & imaging results that were available during my care of the patient were reviewed by me and considered in my medical decision making (see chart for details).     BP 124/79   Pulse 64   Temp 98.8 F (37.1 C) (Oral)   Resp 16   Ht 5\' 10"  (1.778 m)   Wt 95.3 kg (210 lb)   SpO2 99%   BMI 30.13 kg/m    Final Clinical Impressions(s) / ED Diagnoses   Final diagnoses:  Atypical chest pain    New Prescriptions New Prescriptions   No medications on file   2:19 PM Patient with history of anxiety presents here with chest pain. He also reported extensive long travel recently and does endorse some shortness of breath. He denies any pleuritic chest pain however given his risk for PE, will obtain a d-dimer. Otherwise patient may be at low risk for ACS since he reportedly recently had cardiac stress test that was normal.  Care discussed with Dr. Alvino Chapel.    3:44 PM Normal d-dimer and therefore low suspicion for PE. Normal serial troponin. Patient remained symptoms free. He is stable for discharge. He will follow-up with his primary care provider for  further care. Return precaution discussed.   Domenic Moras, PA-C 10/30/17 1552    Davonna Belling, MD 10/31/17 1036

## 2017-10-30 NOTE — ED Notes (Signed)
ED Provider at bedside. 

## 2017-10-31 ENCOUNTER — Ambulatory Visit (INDEPENDENT_AMBULATORY_CARE_PROVIDER_SITE_OTHER): Payer: Commercial Managed Care - PPO

## 2017-10-31 VITALS — BP 129/75 | HR 67 | Temp 97.0°F | Resp 15 | Ht 70.0 in | Wt 213.8 lb

## 2017-10-31 DIAGNOSIS — R51 Headache: Principal | ICD-10-CM

## 2017-10-31 MED ORDER — LYSINE 500 MG OR CAPS: ORAL_CAPSULE | ORAL | Status: AC | PRN

## 2017-10-31 MED ORDER — NATURAL SUPPLEMENT: Freq: Four times a day (QID) | Status: AC

## 2017-10-31 MED ORDER — PROPRANOLOL HCL 10 MG OR TABS: 5.00 mg | ORAL_TABLET | ORAL | Status: AC

## 2017-10-31 MED ORDER — MULTIVITAMIN ADULTS PO: 1.00 | Freq: Every day | ORAL | Status: AC

## 2017-10-31 NOTE — Patient Instructions (Addendum)
It was great to meet you today!    Recommendations for today include:    -Read: The Pathwork of Self-Transformation by Rosezella RumpfEva Pierrakos    -Dark Night of the Soul  Poem by Jonny RuizJohn of the [employer]    -Start AdrenaSoothe tincture at least three times a day and up to six times as needed. Take when feeling anxiety and nervousness. Take one dropperful each time.    -HPA [employer]- 2 capsules in the morning and 2 capsules in the afternoon    -Consider taking a walk on the beach and more relaxing and nourishing activities    -Ensure you are having protein at every meal. Minimize refined carbohydrates and sugars to avoid reactive hypoglycemia (drop in blood sugar).    - Start deep breathing practice for 10 minutes a couple times a day    - Download App: "Stop Breathe Think" for breathing exercises and practice
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
## 2017-11-02 MED ORDER — NUTRITIONAL SUPPLEMENT OR LIQD
ORAL | 2 refills | Status: AC
Start: 2017-11-02 — End: ?

## 2017-11-02 MED ORDER — MISCELLANEOUS COMPOUNDED MEDICATION - NON CONTROLLED
2.00 | Freq: Every day | 2 refills | Status: AC
Start: 2017-11-02 — End: ?

## 2017-11-03 ENCOUNTER — Encounter: Payer: Self-pay | Admitting: Naturopath

## 2017-11-03 ENCOUNTER — Ambulatory Visit (INDEPENDENT_AMBULATORY_CARE_PROVIDER_SITE_OTHER): Payer: Commercial Managed Care - PPO | Admitting: Naturopath

## 2017-11-03 VITALS — BP 127/88 | HR 65 | Temp 96.4°F | Resp 15 | Ht 70.0 in | Wt 213.8 lb

## 2017-11-03 DIAGNOSIS — F410 Panic disorder [episodic paroxysmal anxiety] without agoraphobia: Principal | ICD-10-CM

## 2017-11-03 MED ORDER — SUPER OMEGA-3 PO: 1.00 | Freq: Every day | ORAL | Status: AC

## 2017-11-03 NOTE — Patient Instructions (Addendum)
It was great to see you today!    Recommendations for today include:  - Knowledge that you will go through a difficult part, but your spirit will be less burdened  -Use the language I am ready to. "I am ready for this to be different" instead of I want to or I need to...  -Try looking in a mirror, into your eyes, and giving yourself encouragement (self-love)  - Try reading book: The Drama of the Gifted Child  -  In [place], find someone you can have tea with.
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
## 2017-11-05 ENCOUNTER — Ambulatory Visit (INDEPENDENT_AMBULATORY_CARE_PROVIDER_SITE_OTHER): Payer: Commercial Managed Care - PPO

## 2017-11-05 VITALS — BP 121/76 | HR 84 | Temp 96.6°F | Resp 15 | Ht 70.0 in | Wt 213.8 lb

## 2017-11-05 DIAGNOSIS — F410 Panic disorder [episodic paroxysmal anxiety] without agoraphobia: Principal | ICD-10-CM

## 2017-11-05 MED ORDER — SERTRALINE HCL 50 MG OR TABS: 50.00 mg | ORAL_TABLET | Freq: Every day | ORAL | Status: AC

## 2017-11-05 NOTE — Patient Instructions (Addendum)
Today's recommendations include:    -Find something to ground you and remind you of the new journey when you are in [place]. Consider a talisman you can carry around.  Consider watching Omelia BlackwaterLuther again    -If you find things that make you feel tired, take the time to acknowledge and honor it     - Try the mirror exercise before your next appointment

## 2017-11-05 NOTE — Progress Notes (Signed)
[facility]  Dr. Vivien PrestoArvin Juneau [place], [place]   Department of Medicine  [facility]   [email or web address]  [phone]     Patient Name: Levi HillockMark Evans, 51 year old male  MRN: [id number]      DATE AND TIME OF VISIT: November 05, 2017 4:44 PM    Reason for visit:   Patient is here today to follow up on his concern of anxiety.    HISTORY OF PRESENT ILLNESS:      CC:   1. Anxiety  2. Headache  3. Dizziness    Pt reports his feeling of anxiety has improved. Pt reports sleeping better and feeling more in tune with his wife.  Pt has not taken propranolol for a week. Pt reports being busy all day.  He recently tried cryotherapy, which he appreciated. He has a Stage 3 meniscus tear on his right knee. He has not had a recurrence of pain since his last appointment  Pt started 50mg  of Zoloft one hour ago today. He will take it for one month.  Pt reports "Stress levels have been up and down - out of my element."  He reports he "Saw friends who said they like the vulnerable me."  He reports he is concerned about "how it looks to take a day off"  Pt has declined sabbaticals he was given in the past from work and sought to keep working instead. He has started to think about the bigger picture and what's next.    2. Dizziness  Pt reports he had an episode where he was stressed and found he was able to take a walk and express himself. He had some dizziness, however it did not trigger a panic attack.     Outpatient Prescriptions Marked as Taking for the 11/05/17 encounter (Office Visit) with Koren ShiverSAMUELI Eymi Lipuma/CARLTON   Medication Sig Dispense Refill    Lysine 500 MG CAPS as needed.      miscellaneous compounded medication Take 2 capsules by mouth daily. HPA [employer] (Patient taking differently: Take 2 capsules by mouth 2 times daily. HPA [employer] ) 120 capsule 2    Multiple Vitamins-Minerals (MULTIVITAMIN ADULTS PO) Take 1 tablet by mouth daily.         natural supplement Take by mouth 4 times daily. Stress care      NUTRITIONAL SUPPLEMENT LIQD Adrena Soothe by Aviva Romm  1 dropperful at least 3 times a day 1 Can 2    Omega-3 Fatty Acids (SUPER OMEGA-3 PO) Take 1 tablet by mouth daily. 3-6-9      propranolol (INDERAL) 10 MG tablet Take 5 mg by mouth twice a week.      sertraline (ZOLOFT) 50 MG tablet Take 50 mg by mouth daily.         No past medical history on file.    Patient Active Problem List   Diagnosis    Panic disorder without agoraphobia    Generalized anxiety disorder    Difficulty concentrating    Chronic nonintractable headache, unspecified headache type    Family history of mental disorder    Agitation       No Known Allergies    Social History     Social History    Marital status: Married     Spouse name: N/A    Number of children: N/A    Years of education: N/A     Occupational History    Not on file.  Social History Main Topics    Smoking status: Never Smoker    Smokeless tobacco: Never Used    Alcohol use No    Drug use: No    Sexual activity: Yes     Partners: Female     Other Topics Concern    Not on file     Social History Narrative       Family History   Problem Relation Age of Onset    Heart Disease Father     Hypertension Father     Cholesterol/Lipid Disorder Father     Obesity Father     Hypertension Mother     Cholesterol/Lipid Disorder Mother     Obesity Mother        Review of Systems    OBJECTIVE:    Blood pressure 121/76, pulse 84, temperature 96.6 F (35.9 C), temperature source Tympanic, resp. rate 15, height 5\' 10"  (1.778 m), weight 97 kg (213 lb 13.5 oz), SpO2 96 %.    Physical Exam   Brief & limited. Patient is A&Ox3 and in NAD. Appears well-nourished, well-groomed, non-ill appearing with normal body habitus. Polite & engaged throughout today's visit.     In Body Scan total body water 119.9, lean body mass 163.6, weight 211.6.    LABS REVIEWED: none    ASSESSMENT & PLAN:   Levi HillockMark Evans is a 51 year old year old male returning to clinic to review labs and address chief complaint. his medical history is significant for     Patient Active Problem List   Diagnosis    Panic disorder without agoraphobia    Generalized anxiety disorder    Difficulty concentrating    Chronic nonintractable headache, unspecified headache type    Family history of mental disorder    Agitation       Levi LericheMark was seen today for follow up on his chief concern of anxiety. He is leaving for [place] on Sunday and seeking    Diagnoses and all orders for this visit:    Panic disorder without agoraphobia  Since his last appointment, patient reports reduced symptoms of anxiety and no new panic episodes. Pt reports increased resiliency to stress.    Today's recommendations include:    -Find something to ground you and remind you of the new journey when you are in [place]. Consider a talisman you can carry around.  Consider watching Levi BlackwaterLuther again    -If you find things that make you feel tired, take the time to acknowledge and honor it   Health maintenance. We will obtain age appropriate blood tests for risk stratification and further recommendations. The patient education and counseling were given in nutrition, exercise, and sleep hygiene.    Assessment, diagnosis, plan of treatment were discussed with him in great detail. He was encouraged to call our office or use Patient Portal EMR for any further questions or concerns. He is to return to the clinic in 6 weeks.    Great than 40 minutes were spent with the patient, of which more than 50% of the visit was spent on counseling or coordination of care.    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

## 2017-11-06 ENCOUNTER — Ambulatory Visit (INDEPENDENT_AMBULATORY_CARE_PROVIDER_SITE_OTHER): Payer: Commercial Managed Care - PPO | Admitting: Mechanotherapist

## 2017-11-06 DIAGNOSIS — F411 Generalized anxiety disorder: Principal | ICD-10-CM

## 2017-11-06 NOTE — Progress Notes (Signed)
Patient arrived complaining of having a very bad day in terms of the severity of his anxiety and fear.  He said he has had this type of feeling for the past 30 years of his life and that he feels concerned about his future health.  He said he doesn't know how to let go and doesn't know how to make changes.  His treatment today consisted of aromatherapy, sound therapy, warm herbal oil massage and steam therapy.

## 2017-11-07 ENCOUNTER — Ambulatory Visit (INDEPENDENT_AMBULATORY_CARE_PROVIDER_SITE_OTHER): Payer: Commercial Managed Care - PPO

## 2017-11-07 VITALS — BP 135/86 | HR 57 | Temp 97.8°F | Resp 14 | Ht 70.0 in | Wt 209.4 lb

## 2017-11-07 DIAGNOSIS — F410 Panic disorder [episodic paroxysmal anxiety] without agoraphobia: Principal | ICD-10-CM

## 2017-11-07 NOTE — Patient Instructions (Addendum)
Recommendations for today include:  - Start deep breathing practice for 10 minutes a couple times a day  - Download App: "Stop Breathe Think" for breathing exercises and practice. Alternatives: Calm and Headspace  -  Get a weekly massage in [place] and set an appointment with the chiropractor  - Start with maintaining work life balance boundaries that were put up previously   -  Knowledge that you will go through a difficult part, but your spirit will be less burdened  - Try looking in a mirror, into your eyes, and giving yourself encouragement (self-love)  - Try reading book: The Drama of the Gifted Child  -Read: The Pathwork of Self-Transformation by Rosezella RumpfEva Pierrakos  - Dark Night of the Soul Poem by Jonny RuizJohn of the [employer]  -  Ensure you are having protein at every meal. Minimize refined carbohydrates and sugars to avoid reactive hypoglycemia (drop in blood sugar).  -  Keep a talisman with you to remind you of the journey. Consider keeping a stone or a cross with you to remind you.  -  Learn about who you are without performing  - Mysticism by Harolyn RutherfordEvelyn Underhill    Continue with the following:  - AdrenaSoothe tincture at least three times a day and up to six times as needed. Take when feeling anxiety and nervousness. Take one dropperful each time.    -HPA [employer]- 2 capsules in the morning and 2 capsules in the afternoon

## 2017-11-07 NOTE — Progress Notes (Signed)
[facility]  Levi HammersShreya Osten [place], [place] Resident  Dr. Vivien PrestoArvin Evans, [place]   Department of Medicine  [facility]   [email or web address] [phone]  [phone]     Patient Name: Levi HillockMark Evans, 51 year old male  MRN: [id number]      Reason for visit: Patient is here today to follow up on his concern of anxiety.    LABS REVIEWED: none    HISTORY OF PRESENT ILLNESS:      CC:   1. Dizziness   Pt reports an underlying dizziness all the time, which he attributes to stress. Today he woke up dizzy. Pt had a massage around 10:30AM yesterday. Before he went to bed he had an argument with his wife which is what he reports triggered this episode. He has felt a baseline of continuous dizziness for 15 years. He reports it "ebbs and flows" greatly. He "tries to push through." Currently, he is feeling dizzy at 7 out of 10, although across the course of the appointment it begun to reducce. His blood pressure was "135" this morning, which is high for him, and he reports exacerbates his dizziness.    2.  Anxiety  Currently, he reports anxiety about the future and leaving on Sunday for [place]. He is thinking about having to leave wife.   He took his third dose of Zoloft 50mg  today. He wonders if it is part of why he also feels "a little bit tired." Pt reports he did not sleep very well. Pt has not taken propranol.   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 He is concerned about what other people will think. He is concerned they will think: "We thought you were something and you were'nt," "We thought you could succeed," he is concerned about "losing control and spiralling down." He feels alone and worries about being a "disappointment." He is concerned about becoming homeless, losing  relationships, and he is also "afraid of passing out" because "if he passess out he is going to lose control." He seeks to "make himself valuable" to others. Pt denies having thoughts of hurting himself or others.        Outpatient Prescriptions Marked as Taking for the 11/07/17 encounter (Office Visit) with Levi ShiverSAMUELI Evans/Ethlyn Alto   Medication Sig Dispense Refill    Lysine 500 MG CAPS as needed.      miscellaneous compounded medication Take 2 capsules by mouth daily. HPA [employer] (Patient taking differently: Take 2 capsules by mouth 2 times daily. HPA [employer] ) 120 capsule 2    Multiple Vitamins-Minerals (MULTIVITAMIN ADULTS PO) Take 1 tablet by mouth daily.        NUTRITIONAL SUPPLEMENT LIQD Adrena Soothe by Aviva Romm  1 dropperful at least 3 times a day 1 Can 2    Omega-3 Fatty Acids (SUPER OMEGA-3 PO) Take 1 tablet by mouth daily. 3-6-9      sertraline (ZOLOFT) 50 MG tablet Take 50 mg by mouth daily.         No past medical history on file.    Patient Active Problem List   Diagnosis    Panic disorder without agoraphobia    Generalized anxiety disorder    Difficulty concentrating    Chronic nonintractable headache, unspecified headache type    Family  history of mental disorder    Agitation       No Known Allergies    Social History     Social History    Marital status: Married     Spouse name: N/A    Number of children: N/A    Years of education: N/A     Occupational History    Not on file.     Social History Main Topics    Smoking status: Never Smoker    Smokeless tobacco: Never Used    Alcohol use No    Drug use: No    Sexual activity: Yes     Partners: Female     Other Topics Concern    Not on file     Social History Narrative       Family History   Problem Relation Age of Onset    Heart Disease Father     Hypertension Father     Cholesterol/Lipid Disorder Father     Obesity Father     Hypertension Mother      Cholesterol/Lipid Disorder Mother     Obesity Mother        Review of Systems   Constitutional: Negative.    HENT: Negative.    Eyes: Negative.    Respiratory: Negative.    Cardiovascular: Positive for palpitations.   Gastrointestinal: Negative.    Endocrine: Negative.    Genitourinary: Negative.    Musculoskeletal: Negative.    Skin: Negative.    Allergic/Immunologic: Negative.    Neurological: Positive for dizziness and headaches.   Hematological: Negative.    Psychiatric/Behavioral: Positive for agitation and sleep disturbance. The patient is nervous/anxious.        OBJECTIVE:    Blood pressure 135/86, pulse 57, temperature 97.8 F (36.6 C), temperature source Oral, resp. rate 14, height 5\' 10"  (1.778 m), weight 95 kg (209 lb 7 oz), SpO2 97 %.    Physical Exam   Brief and limited with normal body habitus.  General Appearance: The patient is pleasant, well-groomed, non-ill appearing, and in no apparent distress. Polite & engaged through today's visit.  HEENT: Normocephalic and atraumatic. Pupils are equally round and reactive to light and accommodation. Extraocular muscles are intact.   Respiratory: No intercostal retractions or accessory muscle use were noted.  Neurologic: Patient is alert and oriented x3  Psychiatric: Patient is conscious, cooperative, and well-oriented to time, place, and person. There are no mood swings or psychotic features. Patients insight is good.  Skin: No skin rash, subcutaneous nodules, lesions or ulcers observed.      LABS REVIEWED: none      ASSESSMENT & PLAN:  Levi HillockMark Evans is a 51 year old year old male returning to clinic to review labs and address chief complaint. his medical history is significant for:    Patient Active Problem List   Diagnosis    Panic disorder without agoraphobia    Generalized anxiety disorder    Difficulty concentrating    Chronic nonintractable headache, unspecified headache type    Family history of mental disorder    Agitation        Levi LericheMark was seen today for follow up.    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  His medical history is significant for several trips to the ER due to anxiety and fear of heart-related symptoms - Work-up consistently and repeatedly revealed no cardiovascular and pulmonary involvement.  It is likely, given patient's history and current stressors that he is experiencing anxiety attacks and possible panic - he admits that many of the episodes occur after emotionally charged conversations or anxiety related to his relationship with his wife.     BP was slightly elevated today - patient will start to monitor at home. Pt started Zoloft. Pt also takes propranolol as needed for symptoms of anxiety and dizziness during public speaking.    Recommendations for today include:  - Start deep breathing practice for 10 minutes a couple times a day  - Download App: "Stop Breathe Think" for breathing exercises and practice. Alternatives: Calm and Headspace  -  Get a weekly massage in [place] and set an appointment with the chiropractor  - Start with maintaining work life balance boundaries that were put up previously   -  Knowledge that you will go through a difficult part, but your spirit will be less burdened  - Try looking in a mirror, into your eyes, and giving yourself encouragement (self-love)  - Try reading book: The Drama of the Gifted Child  -Read: The Pathwork of Self-Transformation by Rosezella Rumpf  - Dark Night of the Soul Poem by Jonny Ruiz of the [employer] you are having protein at every meal. Minimize refined carbohydrates and sugars to avoid reactive hypoglycemia (drop in blood sugar).  -  Keep a talisman with you to remind you of the journey. Consider keeping a stone or a cross with you to remind you.  -  Learn about who you are without performing  -  Read Mysticism by Harolyn Rutherford    Continue with the following:  - AdrenaSoothe tincture at least three times a day and up to six times as needed. Take when feeling anxiety and nervousness. Take one dropperful each time.  - HPA [employer]- 2 capsules in the morning and 2 capsules in the afternoon    Health maintenance. We will obtain age appropriate blood tests for risk stratification and further recommendations. The patient education and counseling were given in nutrition, exercise, and sleep hygiene.    Assessment, diagnosis, plan of treatment were discussed with him in great detail. He was encouraged to call our office or use Patient Portal EMR for any further questions or concerns. He is to return to the clinic in 6 weeks.    Great than 50 minutes were spent with the patient, of which more than 50% of the visit was spent on counseling or coordination of care.    FUTURE PLAN:  Check-in with patient through myChart    Levi Evans, [place] Resident  Dr. Vivien Presto, [place]

## 2018-03-25 ENCOUNTER — Telehealth: Payer: Self-pay | Admitting: Naturopath

## 2018-03-25 NOTE — Telephone Encounter (Signed)
DR. Robinette HainesJenab pt.    [facility] called asking for [facility], informed rep not available. Just wanted to inform us Massage therapy is not a covered benefits with pt policy plan.

## 2018-03-26 NOTE — Telephone Encounter (Signed)
[employer], [phone] on 03/26/18 @ 8am. Spoke to representative, [place]. Confirmed massage therapy is an exclusion on this patient's health plan. Ref case#: [id number]

## 2018-07-07 ENCOUNTER — Encounter: Payer: Self-pay | Admitting: Gastroenterology

## 2018-08-10 ENCOUNTER — Ambulatory Visit (INDEPENDENT_AMBULATORY_CARE_PROVIDER_SITE_OTHER): Payer: Managed Care, Other (non HMO) | Admitting: Internal Medicine

## 2018-08-10 ENCOUNTER — Encounter: Payer: Self-pay | Admitting: Internal Medicine

## 2018-08-10 VITALS — BP 90/60 | HR 58 | Temp 97.7°F | Ht 70.75 in | Wt 223.0 lb

## 2018-08-10 DIAGNOSIS — Z0000 Encounter for general adult medical examination without abnormal findings: Secondary | ICD-10-CM

## 2018-08-10 LAB — LIPID PANEL
CHOL/HDL RATIO: 4
CHOLESTEROL: 186 mg/dL (ref 0–200)
HDL: 49.3 mg/dL (ref >39.00)
LDL CALC: 123 mg/dL — AB (ref 0–99)
NonHDL: 136.29
Triglycerides: 68 mg/dL (ref 0.0–149.0)
VLDL: 13.6 mg/dL (ref 0.0–40.0)

## 2018-08-10 LAB — COMPREHENSIVE METABOLIC PANEL
ALBUMIN: 4.5 g/dL (ref 3.5–5.2)
ALT: 25 U/L (ref 0–53)
AST: 23 U/L (ref 0–37)
Alkaline Phosphatase: 87 U/L (ref 39–117)
BILIRUBIN TOTAL: 0.9 mg/dL (ref 0.2–1.2)
BUN: 13 mg/dL (ref 6–23)
CALCIUM: 9.6 mg/dL (ref 8.4–10.5)
CO2: 29 mEq/L (ref 19–32)
CREATININE: 1.08 mg/dL (ref 0.40–1.50)
Chloride: 103 mEq/L (ref 96–112)
GFR: 76.53 mL/min (ref >60.00)
Glucose, Bld: 86 mg/dL (ref 70–99)
Potassium: 4 mEq/L (ref 3.5–5.1)
Sodium: 139 mEq/L (ref 135–145)
Total Protein: 7.1 g/dL (ref 6.0–8.3)

## 2018-08-10 LAB — CBC WITH DIFFERENTIAL/PLATELET
Basophils Absolute: 0.1 10*3/uL (ref 0.0–0.1)
Basophils Relative: 0.8 % (ref 0.0–3.0)
EOS ABS: 0.3 10*3/uL (ref 0.0–0.7)
Eosinophils Relative: 3.9 % (ref 0.0–5.0)
HCT: 46.3 % (ref 39.0–52.0)
HEMOGLOBIN: 16.1 g/dL (ref 13.0–17.0)
LYMPHS PCT: 36.2 % (ref 12.0–46.0)
Lymphs Abs: 2.5 10*3/uL (ref 0.7–4.0)
MCHC: 34.9 g/dL (ref 30.0–36.0)
MCV: 88 fl (ref 78.0–100.0)
MONO ABS: 0.5 10*3/uL (ref 0.1–1.0)
Monocytes Relative: 7.5 % (ref 3.0–12.0)
NEUTROS PCT: 51.6 % (ref 43.0–77.0)
Neutro Abs: 3.6 10*3/uL (ref 1.4–7.7)
PLATELETS: 206 10*3/uL (ref 150.0–400.0)
RBC: 5.26 Mil/uL (ref 4.22–5.81)
RDW: 13.4 % (ref 11.5–15.5)
WBC: 6.9 10*3/uL (ref 4.0–10.5)

## 2018-08-10 LAB — PSA: PSA: 0.62 ng/mL (ref 0.10–4.00)

## 2018-08-10 LAB — TSH: TSH: 2.44 u[IU]/mL (ref 0.35–4.50)

## 2018-08-10 NOTE — Progress Notes (Signed)
Subjective:    Patient ID: Luis Delgado, male    DOB: Jan 11, 1966, 52 y.o.   MRN: [id number]  HPI  52 year old patient who is seen today for a preventive health examination. He enjoys excellent health.  He takes propranolol rarely for a anxiety/panic disorder.  He states that he takes on the average one half of a 10 mg propranolol tablet once per month  No new concerns or complaints  Social history-has spent the past for 5 years in [place].  Divorced 2 adult children.  2 adopted [place] sons.  Non-smoker  Family history.  Both parents are 69 years of age father has a history of type 2 diabetes atrial fibrillation and hypertension; mother with diabetes status post CABG  Past Medical History:  Diagnosis Date  . Anxiety      Social History   Socioeconomic History  . Marital status: Married    Spouse name: Not on file  . Number of children: Not on file  . Years of education: Not on file  . Highest education level: Not on file  Occupational History  . Not on file  Social Needs  . Financial resource strain: Not on file  . Food insecurity:    Worry: Not on file    Inability: Not on file  . Transportation needs:    Medical: Not on file    Non-medical: Not on file  Tobacco Use  . Smoking status: Never Smoker  . Smokeless tobacco: Never Used  Substance and Sexual Activity  . Alcohol use: No  . Drug use: No  . Sexual activity: Not on file  Lifestyle  . Physical activity:    Days per week: Not on file    Minutes per session: Not on file  . Stress: Not on file  Relationships  . Social connections:    Talks on phone: Not on file    Gets together: Not on file    Attends religious service: Not on file    Active member of club or organization: Not on file    Attends meetings of clubs or organizations: Not on file    Relationship status: Not on file  . Intimate partner violence:    Fear of current or ex partner: Not on file    Emotionally abused: Not on file    Physically  abused: Not on file    Forced sexual activity: Not on file  Other Topics Concern  . Not on file  Social History Narrative  . Not on file    Past Surgical History:  Procedure Laterality Date  . TONSILLECTOMY    . VASECTOMY    . VASECTOMY REVERSAL      History reviewed. No pertinent family history.  No Known Allergies  Current Outpatient Medications on File Prior to Visit  Medication Sig Dispense Refill  . Multiple Vitamins-Minerals (MENS MULTIPLUS PO) Take by mouth.    . propranolol (INDERAL) 10 MG tablet Take 5 mg by mouth daily as needed. Anxiety     No current facility-administered medications on file prior to visit.     BP 90/60 (BP Location: Right Arm, Patient Position: Sitting, Cuff Size: Large)   Pulse (!) 58   Temp 97.7 F (36.5 C) (Oral)   Ht 5' 10.75" (1.797 m)   Wt 223 lb (101.2 kg)   SpO2 97%   BMI 31.33 kg/m     Review of Systems  Constitutional: Negative for appetite change, chills, fatigue and fever.  HENT: Negative for congestion,  dental problem, ear pain, hearing loss, sore throat, tinnitus, trouble swallowing and voice change.   Eyes: Negative for pain, discharge and visual disturbance.  Respiratory: Negative for cough, chest tightness, wheezing and stridor.   Cardiovascular: Negative for chest pain, palpitations and leg swelling.  Gastrointestinal: Negative for abdominal distention, abdominal pain, blood in stool, constipation, diarrhea, nausea and vomiting.  Genitourinary: Negative for difficulty urinating, discharge, flank pain, genital sores, hematuria and urgency.  Musculoskeletal: Negative for arthralgias, back pain, gait problem, joint swelling, myalgias and neck stiffness.  Skin: Negative for rash.  Neurological: Negative for dizziness, syncope, speech difficulty, weakness, numbness and headaches.  Hematological: Negative for adenopathy. Does not bruise/bleed easily.  Psychiatric/Behavioral: Negative for behavioral problems and dysphoric  mood. The patient is nervous/anxious.        Objective:   Physical Exam  Constitutional: He appears well-developed and well-nourished.  HENT:  Head: Normocephalic and atraumatic.  Right Ear: External ear normal.  Left Ear: External ear normal.  Nose: Nose normal.  Mouth/Throat: Oropharynx is clear and moist.  Eyes: Pupils are equal, round, and reactive to light. Conjunctivae and EOM are normal. No scleral icterus.  Neck: Normal range of motion. Neck supple. No JVD present. No thyromegaly present.  Cardiovascular: Regular rhythm, normal heart sounds and intact distal pulses. Exam reveals no gallop and no friction rub.  No murmur heard. Pulmonary/Chest: Effort normal and breath sounds normal. He exhibits no tenderness.  Abdominal: Soft. Bowel sounds are normal. He exhibits no distension and no mass. There is no tenderness.  Genitourinary: Penis normal.  Genitourinary Comments: Rectal examination deferred due to impending full colonoscopy  Musculoskeletal: Normal range of motion. He exhibits no edema or tenderness.  Lymphadenopathy:    He has no cervical adenopathy.  Neurological: He is alert. He has normal reflexes. No cranial nerve deficit. Coordination normal.  Skin: Skin is warm and dry. No rash noted.  Psychiatric: He has a normal mood and affect. His behavior is normal.          Assessment & Plan:   Preventive health examination.  Patient is scheduled for initial screening colonoscopy. Right knee pain.  Patient has a meniscal tear and is scheduled for elective right knee surgery  We will review updated screening lab Follow-up in 1 year or as needed with his new provider  Marletta Lor

## 2018-08-10 NOTE — Patient Instructions (Addendum)

## 2018-08-19 ENCOUNTER — Encounter: Payer: Self-pay | Admitting: Gastroenterology

## 2018-08-19 ENCOUNTER — Ambulatory Visit (AMBULATORY_SURGERY_CENTER): Payer: Self-pay | Admitting: *Deleted

## 2018-08-19 VITALS — Ht 70.0 in | Wt 229.0 lb

## 2018-08-19 DIAGNOSIS — Z1211 Encounter for screening for malignant neoplasm of colon: Secondary | ICD-10-CM

## 2018-08-19 NOTE — Progress Notes (Signed)
No egg or soy allergy known to patient  No issues with past sedation with any surgeries  or procedures, no intubation problems  No diet pills per patient No home 02 use per patient  No blood thinners per patient  Pt denies issues with constipation  No A fib or A flutter  EMMI video sent to pt's e mail - pt declined  Suprep and Plenvu are not covered under his insurance- gave suprep sample- Lot [id number]  Exp 7/21 as directed

## 2018-09-02 ENCOUNTER — Ambulatory Visit (AMBULATORY_SURGERY_CENTER): Payer: Managed Care, Other (non HMO) | Admitting: Gastroenterology

## 2018-09-02 ENCOUNTER — Encounter: Payer: Self-pay | Admitting: Gastroenterology

## 2018-09-02 VITALS — BP 116/68 | HR 66 | Temp 97.8°F | Resp 11 | Ht 70.75 in | Wt 223.0 lb

## 2018-09-02 DIAGNOSIS — D122 Benign neoplasm of ascending colon: Secondary | ICD-10-CM

## 2018-09-02 DIAGNOSIS — Z1211 Encounter for screening for malignant neoplasm of colon: Secondary | ICD-10-CM

## 2018-09-02 DIAGNOSIS — K6389 Other specified diseases of intestine: Secondary | ICD-10-CM

## 2018-09-02 DIAGNOSIS — K640 First degree hemorrhoids: Secondary | ICD-10-CM | POA: Diagnosis not present

## 2018-09-02 MED ORDER — SODIUM CHLORIDE 0.9 % IV SOLN: 500.0000 mL | Freq: Once | INTRAVENOUS | Status: DC

## 2018-09-02 NOTE — Progress Notes (Signed)
Report given to PACU, vss 

## 2018-09-02 NOTE — Op Note (Signed)
[place] Patient Name: Luis Delgado Procedure Date: 09/02/2018 8:41 AM MRN: [id number] Endoscopist: Gerrit Heck , MD Age: 52 Referring MD:  Date of Birth: 02-22-1966 Gender: Male Account #: [id number] Procedure:                Colonoscopy Indications:              Screening for colorectal malignant neoplasm, This                            is the patient's first colonoscopy Medicines:                Monitored Anesthesia Care Procedure:                Pre-Anesthesia Assessment:                           - Prior to the procedure, a History and Physical                            was performed, and patient medications and                            allergies were reviewed. The patient's tolerance of                            previous anesthesia was also reviewed. The risks                            and benefits of the procedure and the sedation                            options and risks were discussed with the patient.                            All questions were answered, and informed consent                            was obtained. Prior Anticoagulants: The patient has                            taken no previous anticoagulant or antiplatelet                            agents. ASA Grade Assessment: II - A patient with                            mild systemic disease. After reviewing the risks                            and benefits, the patient was deemed in                            satisfactory condition to undergo the procedure.  After obtaining informed consent, the colonoscope                            was passed under direct vision. Throughout the                            procedure, the patient's blood pressure, pulse, and                            oxygen saturations were monitored continuously. The                            Colonoscope was introduced through the anus and                            advanced to the the terminal  ileum. The colonoscopy                            was performed without difficulty. The patient                            tolerated the procedure well. The quality of the                            bowel preparation was adequate. Scope In: 8:46:50 AM Scope Out: 9:02:53 AM Scope Withdrawal Time: 0 hours 14 minutes 21 seconds  Total Procedure Duration: 0 hours 16 minutes 3 seconds  Findings:                 Hemorrhoids were found on perianal exam.                           A 2 mm polyp was found in the ascending colon. The                            polyp was sessile. The polyp was removed with a                            cold snare. Resection and retrieval were complete.                            Estimated blood loss was minimal.                           Non-bleeding internal hemorrhoids were found during                            retroflexion. The hemorrhoids were small.                           Retroflexion in the right colon was performed. The                            colon was otherwise normal appearing throughout.  The terminal ileum appeared normal. Complications:            No immediate complications. Estimated Blood Loss:     Estimated blood loss was minimal. Impression:               - Hemorrhoids found on perianal exam.                           - One 2 mm polyp in the ascending colon, removed                            with a cold snare. Resected and retrieved.                           - Non-bleeding internal hemorrhoids.                           - The examined portion of the ileum was normal. Recommendation:           - Patient has a contact number available for                            emergencies. The signs and symptoms of potential                            delayed complications were discussed with the                            patient. Return to normal activities tomorrow.                            Written discharge instructions were  provided to the                            patient.                           - Resume previous diet today.                           - Continue present medications.                           - Await pathology results.                           - Repeat colonoscopy in 5-10 years for surveillance                            based on pathology results.                           - Internal hemorrhoids were noted on this study and                            may be amenable to hemorrhoid band ligation. If you  are interested in further treatment of these                            hemorrhoids with band ligation, please contact my                            clinic to set up an appointment for evaluation and                            treatment. Gerrit Heck, MD 09/02/2018 9:07:33 AM

## 2018-09-02 NOTE — Progress Notes (Signed)
Called to room to assist during endoscopic procedure.  Patient ID and intended procedure confirmed with present staff. Received instructions for my participation in the procedure from the performing physician.  

## 2018-09-02 NOTE — Patient Instructions (Signed)
YOU HAD AN ENDOSCOPIC PROCEDURE TODAY AT [facility]:   Refer to the procedure report that was given to you for any specific questions about what was found during the examination.  If the procedure report does not answer your questions, please call your gastroenterologist to clarify.  If you requested that your care partner not be given the details of your procedure findings, then the procedure report has been included in a sealed envelope for you to review at your convenience later.  YOU SHOULD EXPECT: Some feelings of bloating in the abdomen. Passage of more gas than usual.  Walking can help get rid of the air that was put into your GI tract during the procedure and reduce the bloating. If you had a lower endoscopy (such as a colonoscopy or flexible sigmoidoscopy) you may notice spotting of blood in your stool or on the toilet paper. If you underwent a bowel prep for your procedure, you may not have a normal bowel movement for a few days.  Please Note:  You might notice some irritation and congestion in your nose or some drainage.  This is from the oxygen used during your procedure.  There is no need for concern and it should clear up in a day or so.  SYMPTOMS TO REPORT IMMEDIATELY:   Following lower endoscopy (colonoscopy or flexible sigmoidoscopy):  Excessive amounts of blood in the stool  Significant tenderness or worsening of abdominal pains  Swelling of the abdomen that is new, acute  Fever of 100F or higher   Following upper endoscopy (EGD)  Vomiting of blood or coffee ground material  New chest pain or pain under the shoulder blades  Painful or persistently difficult swallowing  New shortness of breath  Fever of 100F or higher  Black, tarry-looking stools  For urgent or emergent issues, a gastroenterologist can be reached at any hour by calling [phone].   DIET:  We do recommend a small meal at first, but then you may proceed to your regular diet.  Drink  plenty of fluids but you should avoid alcoholic beverages for 24 hours.  ACTIVITY:  You should plan to take it easy for the rest of today and you should NOT DRIVE or use heavy machinery until tomorrow (because of the sedation medicines used during the test).    FOLLOW UP: Our staff will call the number listed on your records the next business day following your procedure to check on you and address any questions or concerns that you may have regarding the information given to you following your procedure. If we do not reach you, we will leave a message.  However, if you are feeling well and you are not experiencing any problems, there is no need to return our call.  We will assume that you have returned to your regular daily activities without incident.  If any biopsies were taken you will be contacted by phone or by letter within the next 1-3 weeks.  Please call us at [phone] if you have not heard about the biopsies in 3 weeks.    SIGNATURES/CONFIDENTIALITY: You and/or your care partner have signed paperwork which will be entered into your electronic medical record.  These signatures attest to the fact that that the information above on your After Visit Summary has been reviewed and is understood.  Full responsibility of the confidentiality of this discharge information lies with you and/or your care-partner.YOU HAD AN ENDOSCOPIC PROCEDURE TODAY AT [facility]:  Refer to the procedure report that was given to you for any specific questions about what was found during the examination.  If the procedure report does not answer your questions, please call your gastroenterologist to clarify.  If you requested that your care partner not be given the details of your procedure findings, then the procedure report has been included in a sealed envelope for you to review at your convenience later.  YOU SHOULD EXPECT: Some feelings of bloating in the abdomen. Passage of more gas than  usual.  Walking can help get rid of the air that was put into your GI tract during the procedure and reduce the bloating. If you had a lower endoscopy (such as a colonoscopy or flexible sigmoidoscopy) you may notice spotting of blood in your stool or on the toilet paper. If you underwent a bowel prep for your procedure, you may not have a normal bowel movement for a few days.  Please Note:  You might notice some irritation and congestion in your nose or some drainage.  This is from the oxygen used during your procedure.  There is no need for concern and it should clear up in a day or so.  SYMPTOMS TO REPORT IMMEDIATELY:   Following lower endoscopy (colonoscopy or flexible sigmoidoscopy):  Excessive amounts of blood in the stool  Significant tenderness or worsening of abdominal pains  Swelling of the abdomen that is new, acute  Fever of 100F or higher   For urgent or emergent issues, a gastroenterologist can be reached at any hour by calling [phone].   DIET:  We do recommend a small meal at first, but then you may proceed to your regular diet.  Drink plenty of fluids but you should avoid alcoholic beverages for 24 hours.  ACTIVITY:  You should plan to take it easy for the rest of today and you should NOT DRIVE or use heavy machinery until tomorrow (because of the sedation medicines used during the test).    FOLLOW UP: Our staff will call the number listed on your records the next business day following your procedure to check on you and address any questions or concerns that you may have regarding the information given to you following your procedure. If we do not reach you, we will leave a message.  However, if you are feeling well and you are not experiencing any problems, there is no need to return our call.  We will assume that you have returned to your regular daily activities without incident.  If any biopsies were taken you will be contacted by phone or by letter within the next  1-3 weeks.  Please call us at [phone] if you have not heard about the biopsies in 3 weeks.    SIGNATURES/CONFIDENTIALITY: You and/or your care partner have signed paperwork which will be entered into your electronic medical record.  These signatures attest to the fact that that the information above on your After Visit Summary has been reviewed and is understood.  Full responsibility of the confidentiality of this discharge information lies with you and/or your care-partner.   Polyp, hemorrhoid, and hemorrhoid banding information given.

## 2018-09-02 NOTE — Progress Notes (Signed)
Pt's states no medical or surgical changes since previsit or office visit. 

## 2018-09-03 ENCOUNTER — Telehealth: Payer: Self-pay

## 2018-09-03 NOTE — Telephone Encounter (Signed)
  Follow up Call-  Call back number 09/02/2018  Post procedure Call Back phone  # [phone] cell  Permission to leave phone message Yes  Some recent data might be hidden     Patient questions:  Do you have a fever, pain , or abdominal swelling? No. Pain Score  0 *  Have you tolerated food without any problems? Yes.    Have you been able to return to your normal activities? Yes.    Do you have any questions about your discharge instructions: Diet   No. Medications  No. Follow up visit  No.  Do you have questions or concerns about your Care? No.  Actions: * If pain score is 4 or above: No action needed, pain <4.

## 2018-09-07 ENCOUNTER — Encounter: Payer: Self-pay | Admitting: Gastroenterology

## 2018-09-22 ENCOUNTER — Encounter: Payer: Managed Care, Other (non HMO) | Admitting: Family Medicine

## 2018-10-07 ENCOUNTER — Encounter: Payer: Self-pay | Admitting: Family Medicine

## 2018-10-07 ENCOUNTER — Ambulatory Visit (INDEPENDENT_AMBULATORY_CARE_PROVIDER_SITE_OTHER): Payer: Managed Care, Other (non HMO) | Admitting: Family Medicine

## 2018-10-07 DIAGNOSIS — E669 Obesity, unspecified: Secondary | ICD-10-CM | POA: Insufficient documentation

## 2018-10-07 DIAGNOSIS — F410 Panic disorder [episodic paroxysmal anxiety] without agoraphobia: Secondary | ICD-10-CM

## 2018-10-07 DIAGNOSIS — Z6832 Body mass index (BMI) 32.0-32.9, adult: Secondary | ICD-10-CM

## 2018-10-07 NOTE — Progress Notes (Signed)
   Subjective:  Luis Delgado is a 52 y.o. male who presents today with a chief complaint of obesity and to transfer care.   HPI:  Obesity, chronic problem, stable Several year history.  He has been trying to be more active and eat a healthier diet.  Things have worsened since moving back from [place] a few months ago.  Anxiety/panic disorder, chronic problem, stable Takes propranolol as needed.  Very infrequently has to do this.  ROS: Per HPI  PMH: He reports that he has never smoked. He has never used smokeless tobacco. He reports that he does not drink alcohol or use drugs.  Objective:  Physical Exam: BP 126/84 (BP Location: Left Arm, Patient Position: Sitting, Cuff Size: Normal)   Pulse 65   Temp 98 F (36.7 C) (Oral)   Ht 5\' 11"  (1.803 m)   Wt 233 lb 3.2 oz (105.8 kg)   SpO2 97%   BMI 32.52 kg/m   Gen: NAD, resting comfortably CV: RRR with no murmurs appreciated Pulm: NWOB, CTAB with no crackles, wheezes, or rhonchi GI: Normal bowel sounds present. Soft, Nontender, Nondistended. MSK: No edema, cyanosis, or clubbing noted Skin: Warm, dry Neuro: Grossly normal, moves all extremities Psych: Normal affect and thought content  Assessment/Plan:  Obesity Discussed lifestyle modifications including regular diet healthy exercise.  Encouraged continued lifestyle modifications.  Follow-up in 1 year.  Panic disorder Well-controlled.  Sees counselor intermittently.  Very infrequently has to take propanolol 5 mg.  Time Spent: I spent >25 minutes face-to-face with the patient, with more than half spent on counseling for management for his panic disorder and obesity.  Algis Greenhouse. Jerline Pain, MD 10/07/2018 12:22 PM

## 2018-10-07 NOTE — Patient Instructions (Signed)
It was very nice to see you today!  You are doing a great job. Keep up the good work!  Come back to see me in 1 year, or sooner as needed.   Take care, Dr Luis Delgado   Preventive Care 51-64 Years, Male Preventive care refers to lifestyle choices and visits with your health care provider that can promote health and wellness. What does preventive care include?  A yearly physical exam. This is also called an annual well check.  Dental exams once or twice a year.  Routine eye exams. Ask your health care provider how often you should have your eyes checked.  Personal lifestyle choices, including: ? Daily care of your teeth and gums. ? Regular physical activity. ? Eating a healthy diet. ? Avoiding tobacco and drug use. ? Limiting alcohol use. ? Practicing safe sex. ? Taking low-dose aspirin every day starting at age 51. What happens during an annual well check? The services and screenings done by your health care provider during your annual well check will depend on your age, overall health, lifestyle risk factors, and family history of disease. Counseling Your health care provider may ask you questions about your:  Alcohol use.  Tobacco use.  Drug use.  Emotional well-being.  Home and relationship well-being.  Sexual activity.  Eating habits.  Work and work Statistician.  Screening You may have the following tests or measurements:  Height, weight, and BMI.  Blood pressure.  Lipid and cholesterol levels. These may be checked every 5 years, or more frequently if you are over 51 years old.  Skin check.  Lung cancer screening. You may have this screening every year starting at age 51 if you have a 30-pack-year history of smoking and currently smoke or have quit within the past 15 years.  Fecal occult blood test (FOBT) of the stool. You may have this test every year starting at age 51.  Flexible sigmoidoscopy or colonoscopy. You may have a sigmoidoscopy every 5 years  or a colonoscopy every 10 years starting at age 51.  Prostate cancer screening. Recommendations will vary depending on your family history and other risks.  Hepatitis C blood test.  Hepatitis B blood test.  Sexually transmitted disease (STD) testing.  Diabetes screening. This is done by checking your blood sugar (glucose) after you have not eaten for a while (fasting). You may have this done every 1-3 years.  Discuss your test results, treatment options, and if necessary, the need for more tests with your health care provider. Vaccines Your health care provider may recommend certain vaccines, such as:  Influenza vaccine. This is recommended every year.  Tetanus, diphtheria, and acellular pertussis (Tdap, Td) vaccine. You may need a Td booster every 10 years.  Varicella vaccine. You may need this if you have not been vaccinated.  Zoster vaccine. You may need this after age 51.  Measles, mumps, and rubella (MMR) vaccine. You may need at least one dose of MMR if you were born in 1957 or later. You may also need a second dose.  Pneumococcal 13-valent conjugate (PCV13) vaccine. You may need this if you have certain conditions and have not been vaccinated.  Pneumococcal polysaccharide (PPSV23) vaccine. You may need one or two doses if you smoke cigarettes or if you have certain conditions.  Meningococcal vaccine. You may need this if you have certain conditions.  Hepatitis A vaccine. You may need this if you have certain conditions or if you travel or work in places where you may  be exposed to hepatitis A.  Hepatitis B vaccine. You may need this if you have certain conditions or if you travel or work in places where you may be exposed to hepatitis B.  Haemophilus influenzae type b (Hib) vaccine. You may need this if you have certain risk factors.  Talk to your health care provider about which screenings and vaccines you need and how often you need them. This information is not  intended to replace advice given to you by your health care provider. Make sure you discuss any questions you have with your health care provider. Document Released: 01/12/2016 Document Revised: 09/04/2016 Document Reviewed: 10/17/2015 Elsevier Interactive Patient Education  [employer].

## 2018-10-07 NOTE — Assessment & Plan Note (Signed)
Well-controlled.  Sees counselor intermittently.  Very infrequently has to take propanolol 5 mg.

## 2018-10-07 NOTE — Assessment & Plan Note (Signed)
Discussed lifestyle modifications including regular diet healthy exercise.  Encouraged continued lifestyle modifications.  Follow-up in 1 year.

## 2018-11-18 IMAGING — DX DG CHEST 2V
2 series · 2 of 2 positions shown · non-contrast
Comparison: Chest x-ray of February 06, 2004

CLINICAL DATA: Chest tightness associated with shortness of breath
and dizziness beginning at 3 a.m. today. Nonsmoker.

EXAM:
CHEST  2 VIEW

[w chest pa]
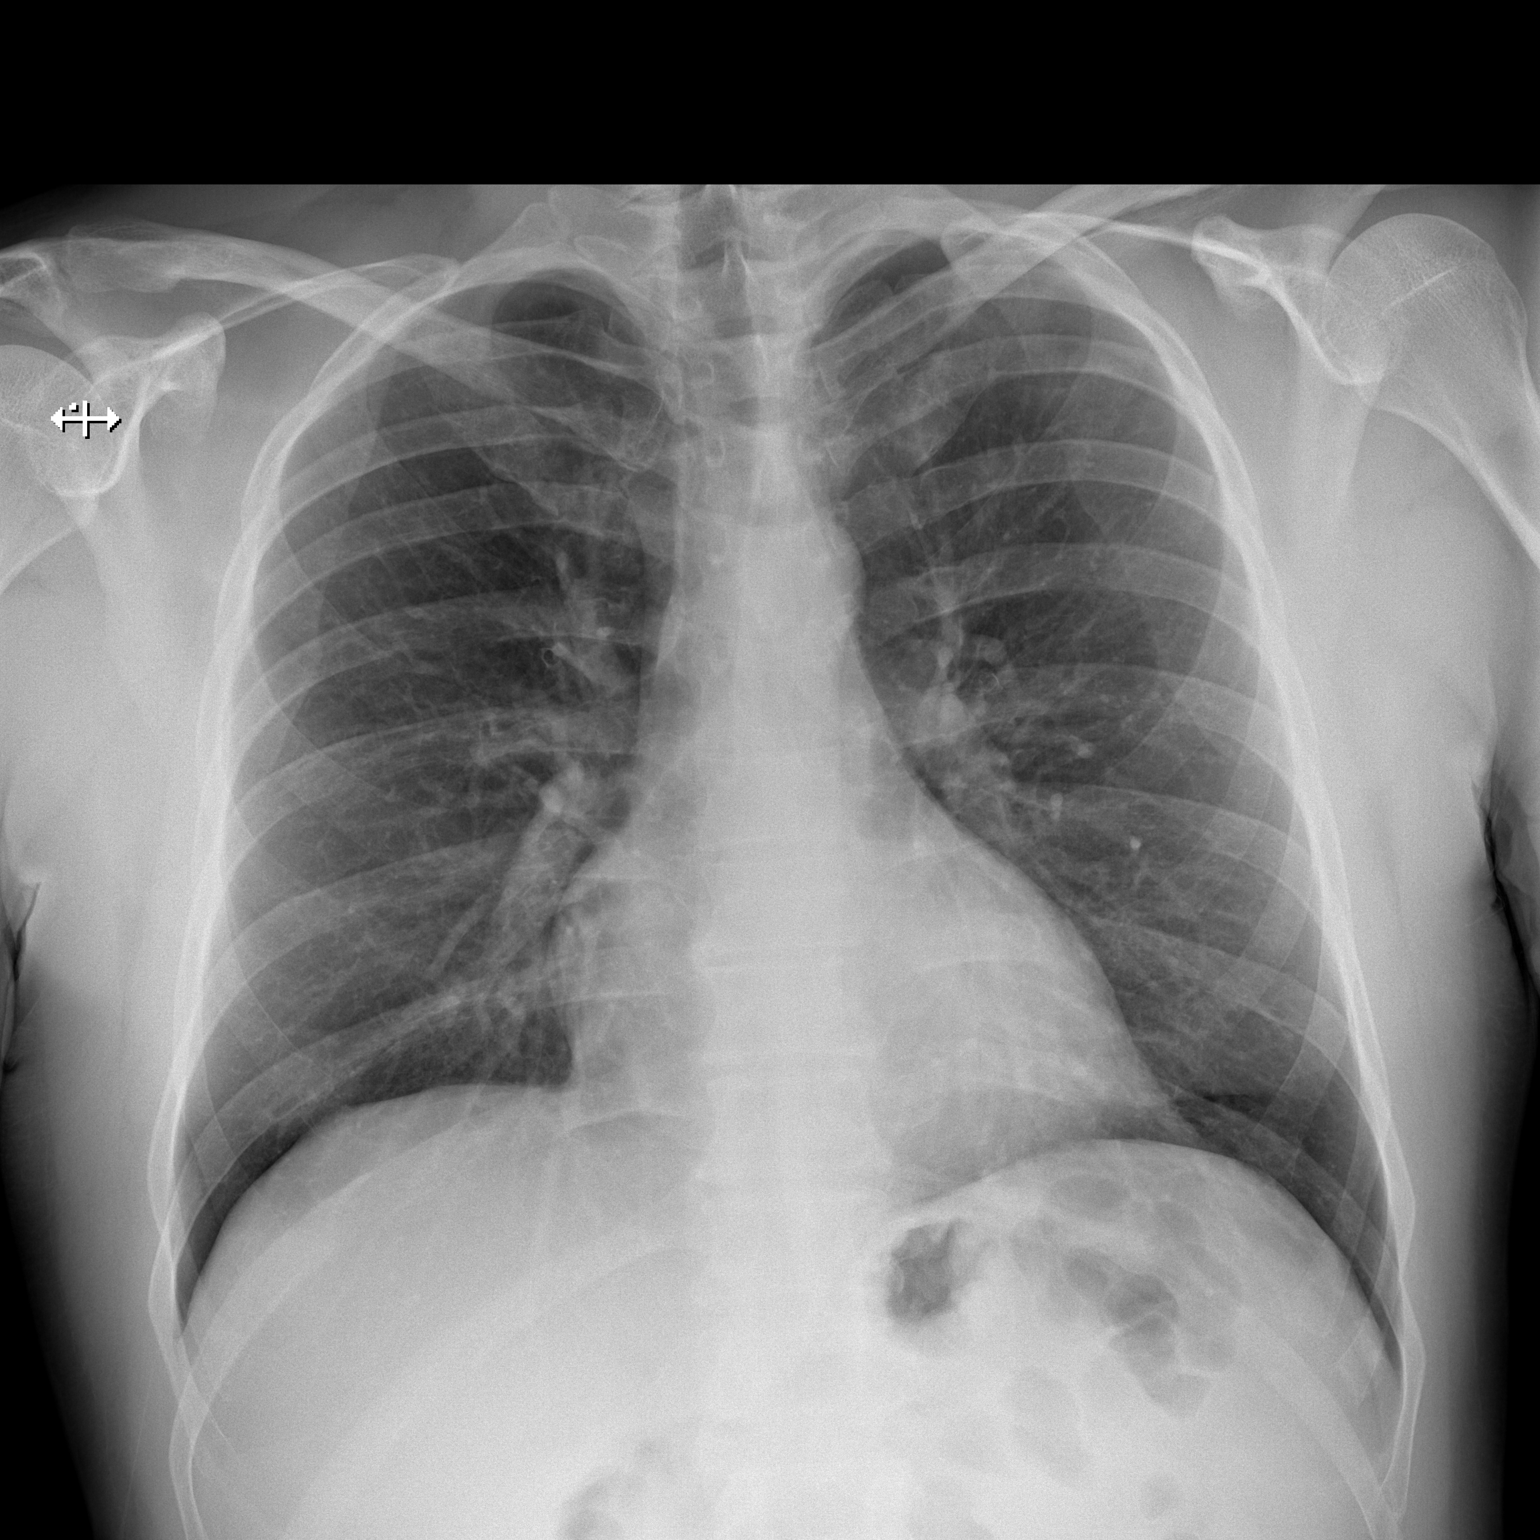

[w chest lat]
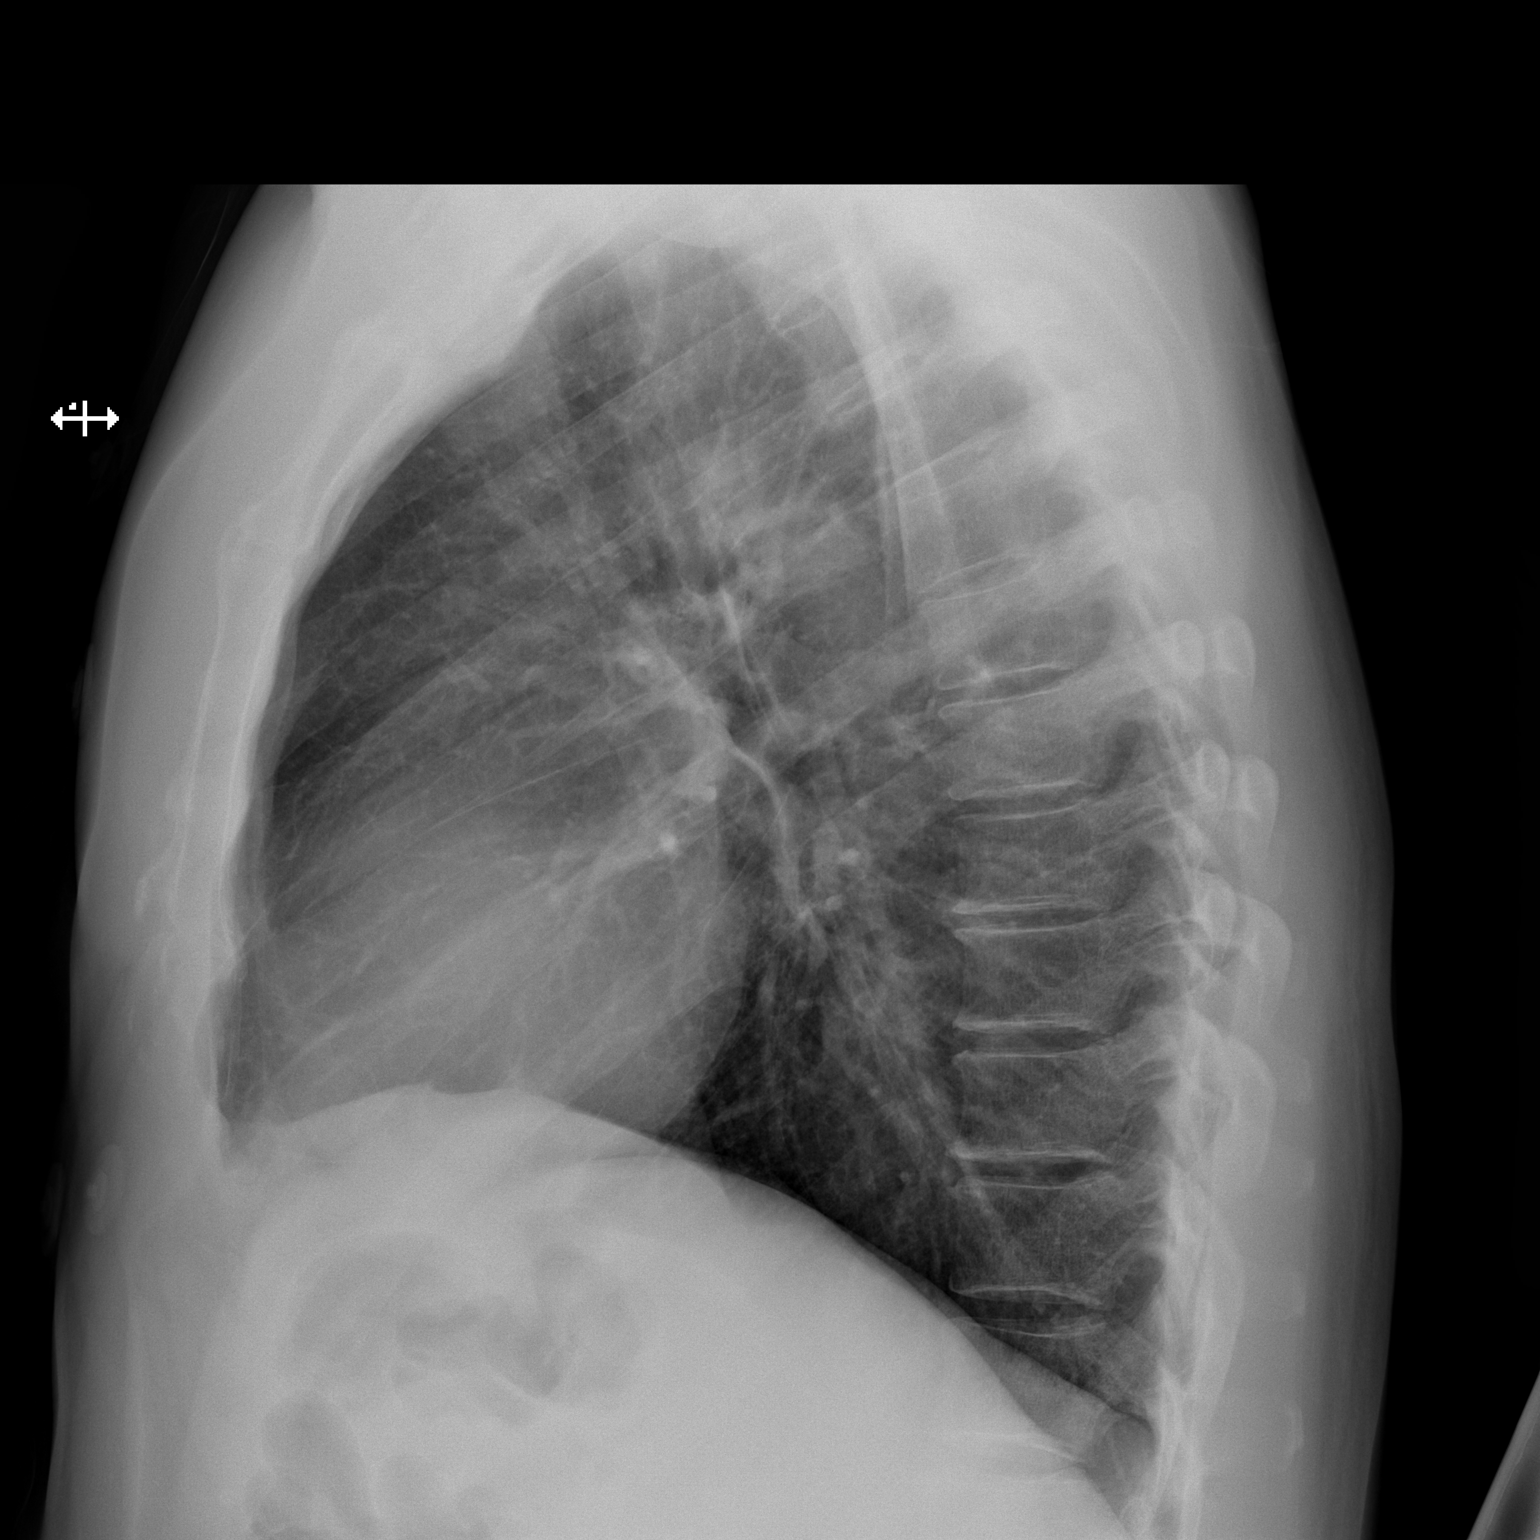

[2 of 2 positions shown; findings below may reference images not displayed]

FINDINGS: The lungs are well-expanded. There is no focal infiltrate. There is
no pleural effusion or pneumothorax. The heart and pulmonary
vascularity are normal. The mediastinum is normal in width. The bony
thorax is unremarkable.
IMPRESSION: There is no active cardiopulmonary disease.

## 2018-12-31 ENCOUNTER — Ambulatory Visit: Payer: Self-pay | Admitting: *Deleted

## 2018-12-31 NOTE — Telephone Encounter (Signed)
Patient's b/p today 160/94 and 150/90. No hx of htn. Stated he does have anxiety issues and feels he is a hypochondriac. Denies weakness/numbness/CP/SOB. States he may have some decreased vision but is relating it to possibly anxiety. Reviewed symptoms needing immediate evaluation. Stated he understood. Appointment made for tomorrow with Dr. Jerline Pain.  Reason for Disposition . [0] Systolic BP  >= 938 OR Diastolic >= 80 AND [1] pregnant  Answer Assessment - Initial Assessment Questions 1. BLOOD PRESSURE: "What is the blood pressure?" "Did you take at least two measurements 5 minutes apart?"     160/94, p. 75  Now and yesterday it was 140/92 2. ONSET: "When did you take your blood pressure?"     Now 3. HOW: "How did you obtain the blood pressure?" (e.g., visiting nurse, automatic home BP monitor)     Home monitor 4. HISTORY: "Do you have a history of high blood pressure?"     now 5. MEDICATIONS: "Are you taking any medications for blood pressure?" "Have you missed any doses recently?"     no 6. OTHER SYMPTOMS: "Do you have any symptoms?" (e.g., headache, chest pain, blurred vision, difficulty breathing, weakness)     Anxiety, vision blurred he thinks. Headache mild over the weekend.None of the other sy 7. PREGNANCY: "Is there any chance you are pregnant?" "When was your last menstrual period?"     na  Protocols used: HIGH BLOOD PRESSURE-A-AH

## 2019-01-01 ENCOUNTER — Encounter: Payer: Self-pay | Admitting: Family Medicine

## 2019-01-01 ENCOUNTER — Ambulatory Visit (INDEPENDENT_AMBULATORY_CARE_PROVIDER_SITE_OTHER): Payer: BLUE CROSS/BLUE SHIELD | Admitting: Family Medicine

## 2019-01-01 VITALS — BP 110/80 | HR 65 | Temp 97.9°F | Ht 71.0 in | Wt 236.2 lb

## 2019-01-01 DIAGNOSIS — Z6832 Body mass index (BMI) 32.0-32.9, adult: Secondary | ICD-10-CM | POA: Diagnosis not present

## 2019-01-01 DIAGNOSIS — F419 Anxiety disorder, unspecified: Secondary | ICD-10-CM

## 2019-01-01 DIAGNOSIS — R030 Elevated blood-pressure reading, without diagnosis of hypertension: Secondary | ICD-10-CM

## 2019-01-01 DIAGNOSIS — E669 Obesity, unspecified: Secondary | ICD-10-CM

## 2019-01-01 NOTE — Assessment & Plan Note (Signed)
Had lengthy discussion with patient regarding healthy food choices and importance of regular exercise.  Follow-up with me first preventive visit later this year.

## 2019-01-01 NOTE — Progress Notes (Signed)
Subjective:  Luis Delgado is a 53 y.o. male who presents today for same-day appointment with a chief complaint of elevated BP readings.   HPI:  Elevated BP readings, New problem Patient has noticed increased BP readings over the past couple of weeks. States that he will feel "off" such as being dizzy or nauseated and then check his BP. BP readings are typically in the 150s-160s/80s-90s when he first checks them however typically dropping below 120s to 30s over 80s on recheck 5 to 10 minutes later.  States that he feels very anxious while checking his blood pressure and typically has racing thoughts regarding his overall health.  Typically attributes this somatic symptom to some other serious pathology such as heart issue or aneurysm.  Patient is aware that these thoughts are irrational and is usually able to calm himself down after several minutes.  Patient issues with anxiety for most of his life.  He has been prescribed propanolol for performance anxiety in the past which he takes once every couple of months.  He has previously seen a counselor however is not seeing any behavioral health specialist at the current time.  He does not like to take medications.  He is interested in seeing a therapist.  Obesity Patient admits to a few dietary indiscretions since coming to [place] from [place] couple of months ago.  Has been exercising more including weight training and cardio exercises.  ROS: Per HPI  PMH: He reports that he has never smoked. He has never used smokeless tobacco. He reports that he does not drink alcohol or use drugs.  Objective:  Physical Exam: BP 110/80 (BP Location: Left Arm, Patient Position: Sitting, Cuff Size: Large)   Pulse 65   Temp 97.9 F (36.6 C) (Oral)   Ht 5\' 11"  (1.803 m)   Wt 236 lb 3.2 oz (107.1 kg)   SpO2 97%   BMI 32.94 kg/m   Wt Readings from Last 3 Encounters:  01/01/19 236 lb 3.2 oz (107.1 kg)  10/07/18 233 lb 3.2 oz (105.8 kg)  09/02/18 223 lb  (101.2 kg)  Gen: NAD, resting comfortably CV: RRR with no murmurs appreciated Pulm: NWOB, CTAB with no crackles, wheezes, or rhonchi Neuro: Grossly normal, moves all extremities Psych: Normal affect and thought content  Assessment/Plan:  Obesity Had lengthy discussion with patient regarding healthy food choices and importance of regular exercise.  Follow-up with me first preventive visit later this year.  Anxiety Patient has underlying anxiety which is main contributor to his somatic symptoms.  Had a lengthy discussion with patient regarding treatment options.  He is opposed to starting daily medications however he is very interested in seeing a therapist.  Gave pamphlet with information for seeing a behavioral specialist.  He will check with his insurance to make sure this is covered.  The meantime, he will continue taking his propranolol as previously given to him in [place].  Elevated BP Reading At goal today.  His home readings are usually at goal as well.  He does have a few isolated elevated readings that are likely secondary to underlying anxiety.  He has a normal cardiovascular exam today.  Reassured patient.  Discussed proper way to check home blood pressure.  Recommended checking at the same time every day early in the morning after sitting for 5 to 10 minutes.  Advised him to avoid checking blood pressure while he is feeling stressed or anxious.  Discussed reasons to return to care.  Continue home blood pressure monitoring  with goal 140/90 or lower.  Time Spent: I spent >40 minutes face-to-face with the patient, with more than half spent on coordinating care and counseling for treatment plan for his anxiety, elevated blood pressure readings, and obesity.   Algis Greenhouse. Jerline Pain, MD 01/01/2019 2:40 PM

## 2019-01-01 NOTE — Assessment & Plan Note (Signed)
Patient has underlying anxiety which is main contributor to his somatic symptoms.  Had a lengthy discussion with patient regarding treatment options.  He is opposed to starting daily medications however he is very interested in seeing a therapist.  Gave pamphlet with information for seeing a behavioral specialist.  He will check with his insurance to make sure this is covered.  The meantime, he will continue taking his propranolol as previously given to him in [place].

## 2019-01-01 NOTE — Patient Instructions (Addendum)
It was very nice to see you today!  It is normal for you to have elevated BP readings throughout the day.   Please check your blood pressure at the same time every day. Let me know if your readings are persistently 140/90 or higher.   I think a lot of your symptoms are coming form anxiety. Please follow up with Lattie Haw soon.   Take care, Dr Jerline Pain

## 2019-03-01 DIAGNOSIS — H6690 Otitis media, unspecified, unspecified ear: Secondary | ICD-10-CM | POA: Diagnosis not present

## 2019-03-01 DIAGNOSIS — R509 Fever, unspecified: Secondary | ICD-10-CM | POA: Diagnosis not present

## 2019-03-01 DIAGNOSIS — R05 Cough: Secondary | ICD-10-CM | POA: Diagnosis not present

## 2019-03-01 DIAGNOSIS — R0981 Nasal congestion: Secondary | ICD-10-CM | POA: Diagnosis not present

## 2019-04-30 ENCOUNTER — Encounter: Payer: Self-pay | Admitting: Family Medicine

## 2019-04-30 ENCOUNTER — Ambulatory Visit (INDEPENDENT_AMBULATORY_CARE_PROVIDER_SITE_OTHER): Payer: 59 | Admitting: Family Medicine

## 2019-04-30 ENCOUNTER — Ambulatory Visit: Payer: Self-pay

## 2019-04-30 ENCOUNTER — Other Ambulatory Visit: Payer: Self-pay

## 2019-04-30 VITALS — BP 118/74 | HR 77 | Temp 98.1°F | Ht 71.0 in | Wt 233.6 lb

## 2019-04-30 DIAGNOSIS — R0789 Other chest pain: Secondary | ICD-10-CM

## 2019-04-30 MED ORDER — DICLOFENAC SODIUM 75 MG PO TBEC: 75.0000 mg | DELAYED_RELEASE_TABLET | Freq: Two times a day (BID) | ORAL | 0 refills | Status: DC

## 2019-04-30 MED ORDER — PANTOPRAZOLE SODIUM 40 MG PO TBEC: 40.0000 mg | DELAYED_RELEASE_TABLET | Freq: Every day | ORAL | 3 refills | Status: DC

## 2019-04-30 NOTE — Patient Instructions (Signed)
It was very nice to see you today!  Please start the diclofenac and protonix  Let me know if your symptoms are not improving in 1-2 weeks.   Take care, Dr Jerline Pain

## 2019-04-30 NOTE — Telephone Encounter (Signed)
Patient in office for appointment now.

## 2019-04-30 NOTE — Telephone Encounter (Signed)
Patient called and says for the past week he's been having chest tightness, but feels like a fullness in his chest more than tightness. He says it doesn't interfere with him working out and he says he's been doing a lot of chest work outs. He says he has shooting pain that lasts a few seconds when he's lying down and it wakes him up sometimes. He says the shooting pain doesn't radiate anywhere. He denies any other symptoms, no cough, no SOB. called the office and spoke to [place], [facility] who asks to speak to the patient, the call was connected successfully.  Answer Assessment - Initial Assessment Questions 1. LOCATION: "Where does it hurt?"      Fullness in my chest, shooting pain when lying down, wakes me up at night 2. RADIATION: "Does the pain go anywhere else?" (e.g., into neck, jaw, arms, back)     No 3. ONSET: "When did the chest pain begin?" (Minutes, hours or days)      Going on all week 4. PATTERN "Does the pain come and go, or has it been constant since it started?"  "Does it get worse with exertion?"      Comes and goes, not as bad when working out, lying is worse 5. DURATION: "How long does it last" (e.g., seconds, minutes, hours)     Shooting pain is seconds 6. SEVERITY: "How bad is the pain?"  (e.g., Scale 1-10; mild, moderate, or severe)    - MILD (1-3): doesn't interfere with normal activities     - MODERATE (4-7): interferes with normal activities or awakens from sleep    - SEVERE (8-10): excruciating pain, unable to do any normal activities       Mild-Moderate 7. CARDIAC RISK FACTORS: "Do you have any history of heart problems or risk factors for heart disease?" (e.g., prior heart attack, angina; high blood pressure, diabetes, being overweight, high cholesterol, smoking, or strong family history of heart disease)     Strong family history 8. PULMONARY RISK FACTORS: "Do you have any history of lung disease?"  (e.g., blood clots in lung, asthma, emphysema, birth control pills)  No 9. CAUSE: "What do you think is causing the chest pain?"     I don't know 10. OTHER SYMPTOMS: "Do you have any other symptoms?" (e.g., dizziness, nausea, vomiting, sweating, fever, difficulty breathing, cough)      No 11. PREGNANCY: "Is there any chance you are pregnant?" "When was your last menstrual period?"      N/A  Protocols used: CHEST PAIN-A-AH

## 2019-04-30 NOTE — Progress Notes (Signed)
   Chief Complaint:  Luis Delgado is a 53 y.o. male who presents for same day appointment with a chief complaint of chest soreness/tightness.   Assessment/Plan:  Chest Wall Pain Reassuring history and normal EKG-doubt cardiac etiology.  Very likely this represents a muscular strain given his recent vigorous workouts.  He also has some underlying reflux that could be contributing as well.  Will start diclofenac or Protonix for the next 1 to 2 weeks to treat for above.  Discussed reasons to return to care and seek emergent care.  Follow-up as needed.    Subjective:  HPI:  Chest Soreness Symptoms started 1 to 2 weeks ago.  He has had occasional soreness in his chest described as a sharp, shooting sensation going into the center of his chest.  They last for a few minutes and then subside.  He has a longstanding history of reflux and thinks this could be contributing.  He has been otherwise in his normal state of health.  No fevers.  No cough.  No shortness of breath.  Symptoms are nonexertional.  He is very active and works out daily.  He is able to do his normal exercise routine including playing basketball, weightlifting, and cardio without exacerbating his symptoms.  He has been heavily focusing on his pectoralis muscles over the last few weeks as well.  No reported lower extremity swelling.  No specific treatments tried.   ROS: Per HPI  PMH: He reports that he has never smoked. He has never used smokeless tobacco. He reports that he does not drink alcohol or use drugs.      Objective:  Physical Exam: BP 118/74 (BP Location: Left Arm, Patient Position: Sitting, Cuff Size: Large)   Pulse 77   Temp 98.1 F (36.7 C) (Oral)   Ht 5\' 11"  (1.803 m)   Wt 233 lb 9.6 oz (106 kg)   SpO2 96%   BMI 32.58 kg/m   Gen: NAD, resting comfortably CV: Regular rate and rhythm with no murmurs appreciated CHEST: No deformities.  Nontender to palpation. Pulm: Normal work of breathing, clear to  auscultation bilaterally with no crackles, wheezes, or rhonchi MSK: No edema, cyanosis, or clubbing noted Skin: Warm, dry Neuro: Grossly normal, moves all extremities Psych: Normal affect and thought content  EKG: NSR with no ischemic changes.      Time Spent: I spent >25 minutes face-to-face with the patient, with more than half spent on counseling for management plan for his chest wall pain.  Algis Greenhouse. Jerline Pain, MD 04/30/2019 4:41 PM

## 2019-04-30 NOTE — Telephone Encounter (Signed)
See triage note.

## 2019-11-14 ENCOUNTER — Encounter (HOSPITAL_COMMUNITY): Payer: Self-pay | Admitting: *Deleted

## 2019-11-14 ENCOUNTER — Emergency Department (HOSPITAL_COMMUNITY): Payer: 59

## 2019-11-14 ENCOUNTER — Emergency Department (HOSPITAL_COMMUNITY)
Admission: EM | Admit: 2019-11-14 | Discharge: 2019-11-14 | Disposition: A | Discharge status: Home / Self Care | Payer: 59 | Attending: Emergency Medicine | Admitting: Emergency Medicine

## 2019-11-14 ENCOUNTER — Other Ambulatory Visit: Payer: Self-pay

## 2019-11-14 DIAGNOSIS — Z79899 Other long term (current) drug therapy: Secondary | ICD-10-CM | POA: Insufficient documentation

## 2019-11-14 DIAGNOSIS — R0789 Other chest pain: Secondary | ICD-10-CM | POA: Insufficient documentation

## 2019-11-14 DIAGNOSIS — R079 Chest pain, unspecified: Secondary | ICD-10-CM | POA: Diagnosis present

## 2019-11-14 LAB — BASIC METABOLIC PANEL
Anion gap: 10 (ref 5–15)
BUN: 14 mg/dL (ref 6–20)
CO2: 22 mmol/L (ref 22–32)
Calcium: 9.2 mg/dL (ref 8.9–10.3)
Chloride: 108 mmol/L (ref 98–111)
Creatinine, Ser: 1.02 mg/dL (ref 0.61–1.24)
GFR calc Af Amer: >60 mL/min (ref >60)
GFR calc non Af Amer: >60 mL/min (ref >60)
Glucose, Bld: 110 mg/dL — ABNORMAL HIGH (ref 70–99)
Potassium: 3.9 mmol/L (ref 3.5–5.1)
Sodium: 140 mmol/L (ref 135–145)

## 2019-11-14 LAB — CBC
HCT: 44.6 % (ref 39.0–52.0)
Hemoglobin: 15.1 g/dL (ref 13.0–17.0)
MCH: 30.5 pg (ref 26.0–34.0)
MCHC: 33.9 g/dL (ref 30.0–36.0)
MCV: 90.1 fL (ref 80.0–100.0)
Platelets: 214 10*3/uL (ref 150–400)
RBC: 4.95 MIL/uL (ref 4.22–5.81)
RDW: 13 % (ref 11.5–15.5)
WBC: 8 10*3/uL (ref 4.0–10.5)
nRBC: 0 % (ref 0.0–0.2)

## 2019-11-14 LAB — TROPONIN I (HIGH SENSITIVITY)
Troponin I (High Sensitivity): 6 ng/L (ref <18)
Troponin I (High Sensitivity): 6 ng/L (ref <18)

## 2019-11-14 MED ORDER — SODIUM CHLORIDE 0.9% FLUSH: 3.0000 mL | Freq: Once | INTRAVENOUS | Status: DC

## 2019-11-14 NOTE — Discharge Instructions (Signed)
Try zantac or pepcid twice a day.  Try to avoid things that may make this worse, most commonly these are spicy foods tomato based products fatty foods chocolate and peppermint.  Alcohol and tobacco can also make this worse.  Return to the emergency department for sudden worsening pain fever or inability to eat or drink. ° °

## 2019-11-14 NOTE — ED Provider Notes (Signed)
[facility] EMERGENCY DEPARTMENT Provider Note   CSN: [id number] Arrival date & time: 11/14/19  0009     History   Chief Complaint Chief Complaint  Patient presents with  . Chest Pain    HPI Luis Delgado is a 53 y.o. male.     53 yo M with a chief complaint of chest pain.  This been going on for the past week or so.  Patient states that he has had issues with chest pain in the past and has been worked up with stress test and has had a cardiac catheterization done in [place].  Reportedly this was completely clean.  He has seen his family doctor for symptoms like this previously and thought to have reflux.  He has medication that was prescribed but he has not started to take it.  Denies any exertional symptoms.  Has some mild shortness of breath with it.  Has a history of anxiety and thinks that this may be the driving force.  He denies history of MI denies history of hypertension hyperlipidemia diabetes smoking.  Father had bypass surgery in his 58s.  No history of PE or DVT no unilateral lower extremity edema and hemoptysis no recent surgery immobilization cancer or testosterone use.  The history is provided by the patient.  Chest Pain Pain location:  Epigastric Pain quality: aching and pressure   Pain radiates to:  Does not radiate Pain severity:  Moderate Onset quality:  Gradual Duration:  2 days Timing:  Intermittent Progression:  Waxing and waning Chronicity:  Recurrent Relieved by:  Nothing Worsened by:  Nothing Ineffective treatments:  None tried Associated symptoms: no abdominal pain, no fever, no headache, no palpitations, no shortness of breath and no vomiting     Past Medical History:  Diagnosis Date  . Anxiety   . Kidney stone    x 1 episode     Patient Active Problem List   Diagnosis Date Noted  . Obesity 10/07/2018  . Anxiety 01/06/2017  . FASCIITIS, [place] 09/18/2007    Past Surgical History:  Procedure Laterality Date  . KNEE  SURGERY    . TONSILLECTOMY    . VASECTOMY    . VASECTOMY REVERSAL    . WISDOM TOOTH EXTRACTION          Home Medications    Prior to Admission medications   Medication Sig Start Date End Date Taking? Authorizing Provider  diclofenac (VOLTAREN) 75 MG EC tablet Take 1 tablet (75 mg total) by mouth 2 (two) times daily. 04/30/19   Vivi Barrack, MD  Lysine 500 MG CAPS as needed.    [provider]  Multiple Vitamins-Minerals (EMERGEN-C VITAMIN C PO) Take by mouth as needed.    [provider]  Multiple Vitamins-Minerals (MENS MULTIPLUS PO) Take by mouth.    [provider]  Omega-3 Fatty Acids (FISH OIL) 1000 MG CAPS Take by mouth.    [provider]  pantoprazole (PROTONIX) 40 MG tablet Take 1 tablet (40 mg total) by mouth daily. 04/30/19   Vivi Barrack, MD  propranolol (INDERAL) 10 MG tablet Take 10 mg by mouth 3 (three) times daily as needed.    [provider]    Family History Family History  Problem Relation Age of Onset  . Colon cancer Neg Hx   . Colon polyps Neg Hx   . Esophageal cancer Neg Hx   . Rectal cancer Neg Hx   . Stomach cancer Neg Hx  Social History Social History   Tobacco Use  . Smoking status: Never Smoker  . Smokeless tobacco: Never Used  Substance Use Topics  . Alcohol use: No  . Drug use: No     Allergies   Patient has no known allergies.   Review of Systems Review of Systems  Constitutional: Negative for chills and fever.  HENT: Negative for congestion and facial swelling.   Eyes: Negative for discharge and visual disturbance.  Respiratory: Negative for shortness of breath.   Cardiovascular: Positive for chest pain. Negative for palpitations.  Gastrointestinal: Negative for abdominal pain, diarrhea and vomiting.  Musculoskeletal: Negative for arthralgias and myalgias.  Skin: Negative for color change and rash.  Neurological: Negative for tremors, syncope and headaches.   Psychiatric/Behavioral: Negative for confusion and dysphoric mood.     Physical Exam Updated Vital Signs BP 121/77 (BP Location: Right Arm)   Pulse (!) 59   Temp 97.6 F (36.4 C) (Oral)   Resp 14   Ht 5\' 11"  (1.803 m)   Wt 104.3 kg   SpO2 98%   BMI 32.08 kg/m   Physical Exam Vitals signs and nursing note reviewed.  Constitutional:      Appearance: He is well-developed.  HENT:     Head: Normocephalic and atraumatic.  Eyes:     Pupils: Pupils are equal, round, and reactive to light.  Neck:     Musculoskeletal: Normal range of motion and neck supple.     Vascular: No JVD.  Cardiovascular:     Rate and Rhythm: Normal rate and regular rhythm.     Heart sounds: No murmur. No friction rub. No gallop.   Pulmonary:     Effort: No respiratory distress.     Breath sounds: No wheezing.  Abdominal:     General: There is no distension.     Tenderness: There is no guarding or rebound.  Musculoskeletal: Normal range of motion.  Skin:    Coloration: Skin is not pale.     Findings: No rash.  Neurological:     Mental Status: He is alert and oriented to person, place, and time.  Psychiatric:        Behavior: Behavior normal.      ED Treatments / Results  Labs (all labs ordered are listed, but only abnormal results are displayed) Labs Reviewed  BASIC METABOLIC PANEL - Abnormal; Notable for the following components:      Result Value   Glucose, Bld 110 (*)    All other components within normal limits  CBC  TROPONIN I (HIGH SENSITIVITY)  TROPONIN I (HIGH SENSITIVITY)    EKG EKG Interpretation  Date/Time:  Sunday November 14 2019 00:22:09 EST Ventricular Rate:  65 PR Interval:  188 QRS Duration: 88 QT Interval:  390 QTC Calculation: 405 R Axis:   19 Text Interpretation: Normal sinus rhythm Low voltage QRS Borderline ECG When compared with ECG of 10/30/2017, Limb lead reversal has been corrected Confirmed by Delora Fuel ([id number]) on 11/14/2019 1:14:26 AM   Radiology  Dg Chest 2 View  Result Date: 11/14/2019 CLINICAL DATA:  Chest pain and shortness of breath for 2 days EXAM: CHEST - 2 VIEW COMPARISON:  10/30/2017 FINDINGS: Cardiac shadow is within normal limits. The lungs are well aerated without focal infiltrate. No acute bony abnormality is noted. IMPRESSION: No active cardiopulmonary disease. Electronically Signed   By: Inez Catalina M.D.   On: 11/14/2019 01:11    Procedures Procedures (including critical care time)  Medications Ordered in  ED Medications  sodium chloride flush (NS) 0.9 % injection 3 mL (has no administration in time range)     Initial Impression / Assessment and Plan / ED Course  I have reviewed the triage vital signs and the nursing notes.  Pertinent labs & imaging results that were available during my care of the patient were reviewed by me and considered in my medical decision making (see chart for details).        53 yo M with a chief complaints of chest pain.  This is atypical in nature.  He had a delta troponin done in triage that was negative.  EKG without ischemic findings.  Chest x-ray viewed by me without focal infiltrate or pneumothorax.  I feel the story is completely atypical of a PE.  At this point will discharge him home.  PCP follow-up.  Trial of Zantac or Pepcid.  6:48 AM:  I have discussed the diagnosis/risks/treatment options with the patient and believe the pt to be eligible for discharge home to follow-up with PCP. We also discussed returning to the ED immediately if new or worsening sx occur. We discussed the sx which are most concerning (e.g., sudden worsening pain, fever, inability to tolerate by mouth) that necessitate immediate return. Medications administered to the patient during their visit and any new prescriptions provided to the patient are listed below.  Medications given during this visit Medications  sodium chloride flush (NS) 0.9 % injection 3 mL (has no administration in time range)     The  patient appears reasonably screen and/or stabilized for discharge and I doubt any other medical condition or other [facility] requiring further screening, evaluation, or treatment in the ED at this time prior to discharge.    Final Clinical Impressions(s) / ED Diagnoses   Final diagnoses:  Atypical chest pain    ED Discharge Orders    None       Deno Etienne, DO 11/14/19 [phone]

## 2019-11-14 NOTE — ED Triage Notes (Signed)
The pt is c/o  Not feeling well for 2 days with chest pain and sob with sl dizziness  bp has been higher

## 2019-11-14 NOTE — ED Notes (Signed)
States he isn't having any chest pain at all now.

## 2020-02-01 ENCOUNTER — Encounter (HOSPITAL_COMMUNITY): Payer: Self-pay | Admitting: Emergency Medicine

## 2020-02-01 ENCOUNTER — Emergency Department (HOSPITAL_COMMUNITY)
Admission: EM | Admit: 2020-02-01 | Discharge: 2020-02-02 | Disposition: A | Discharge status: Home / Self Care | Payer: 59 | Attending: Emergency Medicine | Admitting: Emergency Medicine

## 2020-02-01 ENCOUNTER — Other Ambulatory Visit: Payer: Self-pay

## 2020-02-01 ENCOUNTER — Emergency Department (HOSPITAL_COMMUNITY): Payer: 59

## 2020-02-01 DIAGNOSIS — Z79899 Other long term (current) drug therapy: Secondary | ICD-10-CM | POA: Insufficient documentation

## 2020-02-01 DIAGNOSIS — Z7982 Long term (current) use of aspirin: Secondary | ICD-10-CM | POA: Diagnosis not present

## 2020-02-01 DIAGNOSIS — R519 Headache, unspecified: Secondary | ICD-10-CM | POA: Insufficient documentation

## 2020-02-01 DIAGNOSIS — I10 Essential (primary) hypertension: Secondary | ICD-10-CM | POA: Insufficient documentation

## 2020-02-01 LAB — URINALYSIS, ROUTINE W REFLEX MICROSCOPIC
Bilirubin Urine: NEGATIVE
Glucose, UA: NEGATIVE mg/dL
Hgb urine dipstick: NEGATIVE
Ketones, ur: NEGATIVE mg/dL
Leukocytes,Ua: NEGATIVE
Nitrite: NEGATIVE
Protein, ur: NEGATIVE mg/dL
Specific Gravity, Urine: 1.018 (ref 1.005–1.030)
pH: 8 (ref 5.0–8.0)

## 2020-02-01 LAB — CBC
HCT: 48.1 % (ref 39.0–52.0)
Hemoglobin: 16.5 g/dL (ref 13.0–17.0)
MCH: 30.9 pg (ref 26.0–34.0)
MCHC: 34.3 g/dL (ref 30.0–36.0)
MCV: 90.1 fL (ref 80.0–100.0)
Platelets: 210 10*3/uL (ref 150–400)
RBC: 5.34 MIL/uL (ref 4.22–5.81)
RDW: 13.1 % (ref 11.5–15.5)
WBC: 7.2 10*3/uL (ref 4.0–10.5)
nRBC: 0 % (ref 0.0–0.2)

## 2020-02-01 LAB — BASIC METABOLIC PANEL
Anion gap: 12 (ref 5–15)
BUN: 13 mg/dL (ref 6–20)
CO2: 22 mmol/L (ref 22–32)
Calcium: 9.2 mg/dL (ref 8.9–10.3)
Chloride: 105 mmol/L (ref 98–111)
Creatinine, Ser: 1.11 mg/dL (ref 0.61–1.24)
GFR calc Af Amer: >60 mL/min (ref >60)
GFR calc non Af Amer: >60 mL/min (ref >60)
Glucose, Bld: 110 mg/dL — ABNORMAL HIGH (ref 70–99)
Potassium: 4.1 mmol/L (ref 3.5–5.1)
Sodium: 139 mmol/L (ref 135–145)

## 2020-02-01 LAB — CBG MONITORING, ED: Glucose-Capillary: 101 mg/dL — ABNORMAL HIGH (ref 70–99)

## 2020-02-01 MED ORDER — SODIUM CHLORIDE 0.9% FLUSH: 3.0000 mL | Freq: Once | INTRAVENOUS | Status: DC

## 2020-02-01 NOTE — ED Notes (Signed)
Off to CT

## 2020-02-01 NOTE — Discharge Instructions (Signed)
Mr. Disantis,   While at the emergency department, we recheck your blood pressure and it improved significantly from earlier in the day. Additionally, we did some imaging of your head that did not show any acute changes. Make sure to follow up with your primary care doctor in the next 1-2 weeks.   If you should develop any sudden headache, chest pain, dizziness, please call your doctor or return to the emergency department.

## 2020-02-01 NOTE — ED Notes (Deleted)
Back from MRI.

## 2020-02-01 NOTE — ED Provider Notes (Signed)
[place] EMERGENCY DEPARTMENT Provider Note   CSN: [id number] Arrival date & time: 02/01/20  1658     History Chief Complaint  Patient presents with  . Hypertension    Luis Delgado is a 54 y.o. male with a PMHx of anxiety who presents to the ED with c/o high BP.   Luis Delgado states he's been having a bilateral frontal headache for the past 3 days that has not gotten better. He notes some blurry vision when reading but states it improves when looking up. In addition, he has been having increased stress lately. He checked his BP today and saw it was elevated. He called his doctor, who urged him to go to the ED. He presented to an urgent care center that noted elevated BP of 183/92 and referred patient to the ED with concerns for HTN urgency.    Luis Delgado also notes epigastric abdominal tenderness that has been chronic. He feels this is related to his stress levels as well. He has not yet started daily Omeprazole.   No recent fever, chills, chest pain, SOB, weakness, LOC, paresthesias.   Past Medical History:  Diagnosis Date  . Anxiety   . Kidney stone    x 1 episode     Patient Active Problem List   Diagnosis Date Noted  . Obesity 10/07/2018  . Anxiety 01/06/2017  . FASCIITIS, [place] 09/18/2007    Past Surgical History:  Procedure Laterality Date  . KNEE SURGERY    . TONSILLECTOMY    . VASECTOMY    . VASECTOMY REVERSAL    . WISDOM TOOTH EXTRACTION         Family History  Problem Relation Age of Onset  . Colon cancer Neg Hx   . Colon polyps Neg Hx   . Esophageal cancer Neg Hx   . Rectal cancer Neg Hx   . Stomach cancer Neg Hx     Social History   Tobacco Use  . Smoking status: Never Smoker  . Smokeless tobacco: Never Used  Substance Use Topics  . Alcohol use: No  . Drug use: No    Home Medications Prior to Admission medications   Medication Sig Start Date End Date Taking? Authorizing Provider  aspirin EC 81 MG tablet Take 81  mg by mouth See admin instructions. Take 81 mg by mouth once daily four to five times a week   Yes [provider]  Lysine 500 MG CAPS Take 500 mg by mouth daily as needed (as directed- for flares of canker sores).    Yes [provider]  Multiple Vitamins-Minerals (EMERGEN-C VITAMIN C PO) Take 1 packet by mouth daily.    Yes [provider]  Multiple Vitamins-Minerals (MENS MULTIPLUS PO) Take 1 tablet by mouth daily with breakfast.    Yes [provider]  Omega-3 Fatty Acids (FISH OIL) 1000 MG CAPS Take 1,000 mg by mouth daily after breakfast.    Yes [provider]  pantoprazole (PROTONIX) 40 MG tablet Take 1 tablet (40 mg total) by mouth daily. Patient taking differently: Take 40 mg by mouth daily as needed (for REFLUX).  04/30/19  Yes Vivi Barrack, MD  propranolol (INDERAL) 10 MG tablet Take 5-10 mg by mouth 3 (three) times daily as needed (for anxiety).    Yes [provider]  diclofenac (VOLTAREN) 75 MG EC tablet Take 1 tablet (75 mg total) by mouth 2 (two) times daily. Patient not taking: Reported on 02/01/2020 04/30/19  Vivi Barrack, MD    Allergies    Patient has no known allergies.  Review of Systems   Review of Systems  Constitutional: Negative for chills and fever.  Respiratory: Negative for chest tightness and shortness of breath.   Cardiovascular: Negative for chest pain.  Gastrointestinal: Positive for abdominal pain. Negative for diarrhea, nausea and vomiting.  Neurological: Negative for syncope and weakness.    Physical Exam Updated Vital Signs BP 136/72 (BP Location: Right Arm)   Pulse 62   Temp 98.6 F (37 C) (Oral)   Resp 18   SpO2 96%   Physical Exam Vitals and nursing note reviewed.  Constitutional:      General: He is not in acute distress.    Appearance: He is normal weight.  Eyes:     General: No visual field deficit.    Extraocular Movements: Extraocular movements intact.     Right eye: No  nystagmus.     Left eye: No nystagmus.     Conjunctiva/sclera: Conjunctivae normal.  Cardiovascular:     Rate and Rhythm: Normal rate and regular rhythm.     Pulses:          Dorsalis pedis pulses are 2+ on the right side and 2+ on the left side.     Heart sounds: No murmur.  Pulmonary:     Effort: Pulmonary effort is normal. No respiratory distress.     Breath sounds: Normal breath sounds. No wheezing or rales.  Abdominal:     General: Bowel sounds are normal. There is no distension.     Palpations: Abdomen is soft.     Tenderness: There is no abdominal tenderness. There is no guarding.  Musculoskeletal:     Right lower leg: No edema.     Left lower leg: No edema.  Skin:    General: Skin is warm and dry.     Capillary Refill: Capillary refill takes less than 2 seconds.     Coloration: Skin is not jaundiced.     Findings: No bruising.  Neurological:     General: No focal deficit present.     Mental Status: He is alert and oriented to person, place, and time. Mental status is at baseline.     Cranial Nerves: No facial asymmetry.     Motor: Motor function is intact. No weakness.  Psychiatric:        Mood and Affect: Mood normal.        Behavior: Behavior normal.    ED Results / Procedures / Treatments   Labs (all labs ordered are listed, but only abnormal results are displayed) Labs Reviewed  BASIC METABOLIC PANEL - Abnormal; Notable for the following components:      Result Value   Glucose, Bld 110 (*)    All other components within normal limits  URINALYSIS, ROUTINE W REFLEX MICROSCOPIC - Abnormal; Notable for the following components:   APPearance CLOUDY (*)    All other components within normal limits  CBG MONITORING, ED - Abnormal; Notable for the following components:   Glucose-Capillary 101 (*)    All other components within normal limits  CBC    EKG EKG Interpretation  Date/Time:  Tuesday February 01 2020 17:11:36 EST Ventricular Rate:  84 PR  Interval:  146 QRS Duration: 80 QT Interval:  372 QTC Calculation: 439 R Axis:   52 Text Interpretation: Normal sinus rhythm Cannot rule out Anterior infarct , age undetermined Abnormal ECG Confirmed by Quintella Reichert [phone]) on 02/01/2020  7:05:30 PM   Radiology CT Head Wo Contrast  Result Date: 02/01/2020 CLINICAL DATA:  54 year old male with left-sided headache and blurry vision. History of hypertension. EXAM: CT HEAD WITHOUT CONTRAST TECHNIQUE: Contiguous axial images were obtained from the base of the skull through the vertex without intravenous contrast. COMPARISON:  None. FINDINGS: Brain: The ventricles and sulci appropriate size for patient's age. Faint subcentimeter focus in the inferior aspect of the right lentiform nucleus (series 3, image 17) may represent volume averaging artifact, mildly dilated prevascular space, or an age indeterminate lacunar infarct. Clinical correlation is recommended. There is no acute intracranial hemorrhage. No mass effect or midline shift. No extra-axial fluid collection. Vascular: No hyperdense vessel or unexpected calcification. Skull: Normal. Negative for fracture or focal lesion. Sinuses/Orbits: No acute finding. Cerumen noted in the right external auditory canal. Other: None IMPRESSION: 1. No acute intracranial hemorrhage. 2. Subcentimeter faint hypodense focus inferior to the right lentiform nucleus may be artifactual or represent a mildly dilated prevascular space or an age indeterminate lacunar infarct. Clinical correlation is recommended. Electronically Signed   By: Anner Crete M.D.   On: 02/01/2020 22:40    Procedures Procedures (including critical care time)  Medications Ordered in ED Medications  sodium chloride flush (NS) 0.9 % injection 3 mL (has no administration in time range)    ED Course  I have reviewed the triage vital signs and the nursing notes.  Pertinent labs & imaging results that were available during my care of the patient  were reviewed by me and considered in my medical decision making (see chart for details).  Clinical Course as of Feb 01 10  Tue Feb 01, 2020  [id number] Basic metabolic panel(!) [IB]    Clinical Course User Index [IB] Jose Persia, MD   MDM Rules/Calculators/A&P                      Luis Delgado presents with elevated BP earlier today that has since resolved and decreased to 142/87. His neurological exam is negative for abnormalities and patient is hemodynamically stable. Given he does not have a PMHx of headaches, CT head was obtained and negative for acute intracranial findings. It did note an area of foci and this was discussed with Neurology on call. No interventions required. Discussed with patient to follow up with PCP.   We also discussed the effects of anxiety and diet/lifestyle on BP. I emphasized the importance of a low salt diet and provided information in the AVS regarding this.   At this time, Luis Delgado is stable for discharge. Follow up with PCP in 1-2 weeks recommended.   Final Clinical Impression(s) / ED Diagnoses Final diagnoses:  Hypertension, unspecified type    Rx / DC Orders ED Discharge Orders    None     Dr. Jose Persia Internal Medicine PGY-1  02/02/2020, 12:11 AM    Jose Persia, MD 02/02/20 Jen Mow    Quintella Reichert, MD 02/03/20 [phone]

## 2020-02-01 NOTE — ED Triage Notes (Signed)
Pt arrives to ED with 3 day history of left sided headache blurry vision and hypertension. Pt was just seen PTA at [facility] and was diagnosed as hypertensive urgecy and sent to ED for further work up.

## 2020-02-02 NOTE — ED Notes (Signed)
Pt was discharged from the ED. Pt read and understood discharge paperwork. Pt had vital signs completed. Pt conscious, breathing, and A&Ox4. No distress noted. Pt speaking in complete sentences. Pt ambulated out of the ED with a smooth and steady gait.  

## 2020-02-16 ENCOUNTER — Telehealth: Payer: Self-pay | Admitting: Family Medicine

## 2020-02-16 NOTE — Telephone Encounter (Signed)
Please advise 

## 2020-02-16 NOTE — Telephone Encounter (Signed)
Patient called in and stated that he tested positive for Covid on 02/13/20. Patient just wanted to let Dr. Jerline Pain know. Patient also wanted to know if there anything to help him get over it faster also. Please advise.

## 2020-02-18 NOTE — Telephone Encounter (Signed)
Left voice message for patient to call clinic.  

## 2020-02-18 NOTE — Telephone Encounter (Signed)
No specific recommendations to speed up recovery. Just make sure that he is getting plenty of rest and staying well hydrated.  Would like for him to let us know if not improving.  Luis Delgado. Jerline Pain, MD 02/18/2020 9:53 AM

## 2020-02-22 NOTE — Telephone Encounter (Signed)
Left voice message to call clinic 

## 2020-02-23 ENCOUNTER — Emergency Department (HOSPITAL_COMMUNITY): Payer: 59

## 2020-02-23 ENCOUNTER — Emergency Department (HOSPITAL_COMMUNITY)
Admission: EM | Admit: 2020-02-23 | Discharge: 2020-02-23 | Disposition: A | Discharge status: Home / Self Care | Payer: 59 | Attending: Emergency Medicine | Admitting: Emergency Medicine

## 2020-02-23 ENCOUNTER — Other Ambulatory Visit: Payer: Self-pay

## 2020-02-23 ENCOUNTER — Encounter (HOSPITAL_COMMUNITY): Payer: Self-pay | Admitting: Emergency Medicine

## 2020-02-23 DIAGNOSIS — R42 Dizziness and giddiness: Secondary | ICD-10-CM | POA: Diagnosis not present

## 2020-02-23 DIAGNOSIS — R519 Headache, unspecified: Secondary | ICD-10-CM | POA: Diagnosis not present

## 2020-02-23 DIAGNOSIS — Z7982 Long term (current) use of aspirin: Secondary | ICD-10-CM | POA: Insufficient documentation

## 2020-02-23 DIAGNOSIS — Z79899 Other long term (current) drug therapy: Secondary | ICD-10-CM | POA: Insufficient documentation

## 2020-02-23 LAB — CBC WITH DIFFERENTIAL/PLATELET
Abs Immature Granulocytes: 0.01 10*3/uL (ref 0.00–0.07)
Basophils Absolute: 0 10*3/uL (ref 0.0–0.1)
Basophils Relative: 1 %
Eosinophils Absolute: 0.2 10*3/uL (ref 0.0–0.5)
Eosinophils Relative: 3 %
HCT: 47.8 % (ref 39.0–52.0)
Hemoglobin: 16.6 g/dL (ref 13.0–17.0)
Immature Granulocytes: 0 %
Lymphocytes Relative: 31 %
Lymphs Abs: 1.8 10*3/uL (ref 0.7–4.0)
MCH: 30.2 pg (ref 26.0–34.0)
MCHC: 34.7 g/dL (ref 30.0–36.0)
MCV: 87.1 fL (ref 80.0–100.0)
Monocytes Absolute: 0.5 10*3/uL (ref 0.1–1.0)
Monocytes Relative: 8 %
Neutro Abs: 3.3 10*3/uL (ref 1.7–7.7)
Neutrophils Relative %: 57 %
Platelets: 215 10*3/uL (ref 150–400)
RBC: 5.49 MIL/uL (ref 4.22–5.81)
RDW: 12.1 % (ref 11.5–15.5)
WBC: 5.7 10*3/uL (ref 4.0–10.5)
nRBC: 0 % (ref 0.0–0.2)

## 2020-02-23 LAB — BASIC METABOLIC PANEL
Anion gap: 10 (ref 5–15)
BUN: 10 mg/dL (ref 6–20)
CO2: 27 mmol/L (ref 22–32)
Calcium: 9.4 mg/dL (ref 8.9–10.3)
Chloride: 102 mmol/L (ref 98–111)
Creatinine, Ser: 1.06 mg/dL (ref 0.61–1.24)
GFR calc Af Amer: >60 mL/min (ref >60)
GFR calc non Af Amer: >60 mL/min (ref >60)
Glucose, Bld: 123 mg/dL — ABNORMAL HIGH (ref 70–99)
Potassium: 3.9 mmol/L (ref 3.5–5.1)
Sodium: 139 mmol/L (ref 135–145)

## 2020-02-23 MED ORDER — MECLIZINE HCL 25 MG PO TABS
25.0000 mg | ORAL_TABLET | Freq: Once | ORAL | Status: AC
  Administered 2020-02-23: 07:00:00 25 mg via ORAL
  Filled 2020-02-23: qty 1

## 2020-02-23 MED ORDER — MECLIZINE HCL 25 MG PO TABS: 25.0000 mg | ORAL_TABLET | Freq: Three times a day (TID) | ORAL | 0 refills | Status: DC | PRN

## 2020-02-23 MED ORDER — SODIUM CHLORIDE 0.9 % IV BOLUS
1000.0000 mL | Freq: Once | INTRAVENOUS | Status: AC
  Administered 2020-02-23: 07:00:00 1000 mL via INTRAVENOUS

## 2020-02-23 NOTE — ED Notes (Signed)
Pt ambulated to restroom with steady gait and tolerated well. Denies dizziness and denies SOB. Pt in bed on monitoring devices resting comfortably.

## 2020-02-23 NOTE — ED Triage Notes (Signed)
Pt reports a headache started last night around 6pm last night.  The headache continues to get worse and he has become dizzy.  He was diagnosed w/ COVID on Sunday the 14th of February.  NIH is negative at this time.  Took a "1/2 beta blocker around 10pm for anxiety".

## 2020-02-23 NOTE — ED Provider Notes (Signed)
[facility] [facility] EMERGENCY DEPARTMENT Provider Note   CSN: [id number] Arrival date & time: 02/23/20  0416     History Chief Complaint  Patient presents with  . Headache  . Dizziness    Luis Delgado is a 54 y.o. male.  Patient is a 54 year old male with history of anxiety.  He presents today for evaluation of dizziness and not feeling well.  Patient was diagnosed 2 weeks ago with COVID-19 and symptoms have basically resolved.  Earlier this evening he began to feel dizzy and weak.  He describes a spinning sensation that is worse when he moves.  He also describes the sensation of a "pop" in his head prior to the onset of symptoms.  Patient was seen here 2 weeks ago prior to his Covid diagnosis and underwent head CT and laboratory studies for unrelated complaints.  These were unremarkable and he was discharged to home.  He denies any hearing loss or ringing in his ears.  The history is provided by the patient.  Headache Pain location:  Generalized Quality:  Dull Radiates to:  Does not radiate Onset quality:  Sudden Timing:  Constant Progression:  Unchanged Chronicity:  New Relieved by:  Nothing Worsened by:  Nothing Ineffective treatments:  None tried Associated symptoms: dizziness   Dizziness Associated symptoms: headaches        Past Medical History:  Diagnosis Date  . Anxiety   . Kidney stone    x 1 episode     Patient Active Problem List   Diagnosis Date Noted  . Obesity 10/07/2018  . Anxiety 01/06/2017  . FASCIITIS, [place] 09/18/2007    Past Surgical History:  Procedure Laterality Date  . KNEE SURGERY    . TONSILLECTOMY    . VASECTOMY    . VASECTOMY REVERSAL    . WISDOM TOOTH EXTRACTION         Family History  Problem Relation Age of Onset  . Colon cancer Neg Hx   . Colon polyps Neg Hx   . Esophageal cancer Neg Hx   . Rectal cancer Neg Hx   . Stomach cancer Neg Hx     Social History   Tobacco Use  . Smoking status: Never  Smoker  . Smokeless tobacco: Never Used  Substance Use Topics  . Alcohol use: No  . Drug use: No    Home Medications Prior to Admission medications   Medication Sig Start Date End Date Taking? Authorizing Provider  aspirin EC 81 MG tablet Take 81 mg by mouth See admin instructions. Take 81 mg by mouth once daily four to five times a week    [provider]  diclofenac (VOLTAREN) 75 MG EC tablet Take 1 tablet (75 mg total) by mouth 2 (two) times daily. Patient not taking: Reported on 02/01/2020 04/30/19   Vivi Barrack, MD  Lysine 500 MG CAPS Take 500 mg by mouth daily as needed (as directed- for flares of canker sores).     [provider]  Multiple Vitamins-Minerals (EMERGEN-C VITAMIN C PO) Take 1 packet by mouth daily.     [provider]  Multiple Vitamins-Minerals (MENS MULTIPLUS PO) Take 1 tablet by mouth daily with breakfast.     [provider]  Omega-3 Fatty Acids (FISH OIL) 1000 MG CAPS Take 1,000 mg by mouth daily after breakfast.     [provider]  pantoprazole (PROTONIX) 40 MG tablet Take 1 tablet (40 mg total) by mouth daily. Patient taking differently: Take 40  mg by mouth daily as needed (for REFLUX).  04/30/19   Vivi Barrack, MD  propranolol (INDERAL) 10 MG tablet Take 5-10 mg by mouth 3 (three) times daily as needed (for anxiety).     [provider]    Allergies    Patient has no known allergies.  Review of Systems   Review of Systems  Neurological: Positive for dizziness and headaches.  All other systems reviewed and are negative.   Physical Exam Updated Vital Signs BP 109/81 (BP Location: Right Arm)   Pulse 71   Temp 97.6 F (36.4 C) (Oral)   Resp 18   Ht 5\' 10"  (1.778 m)   Wt 102.1 kg   SpO2 97%   BMI 32.28 kg/m   Physical Exam Vitals and nursing note reviewed.  Constitutional:      General: He is not in acute distress.    Appearance: He is well-developed. He is not diaphoretic.  HENT:      Head: Normocephalic and atraumatic.  Eyes:     General: No visual field deficit.    Extraocular Movements: Extraocular movements intact.     Pupils: Pupils are equal, round, and reactive to light.  Cardiovascular:     Rate and Rhythm: Normal rate and regular rhythm.     Heart sounds: No murmur. No friction rub.  Pulmonary:     Effort: Pulmonary effort is normal. No respiratory distress.     Breath sounds: Normal breath sounds. No wheezing or rales.  Abdominal:     General: Bowel sounds are normal. There is no distension.     Palpations: Abdomen is soft.     Tenderness: There is no abdominal tenderness.  Musculoskeletal:        General: Normal range of motion.     Cervical back: Normal range of motion and neck supple.  Skin:    General: Skin is warm and dry.  Neurological:     Mental Status: He is alert and oriented to person, place, and time.     Cranial Nerves: No cranial nerve deficit, dysarthria or facial asymmetry.     Coordination: Coordination normal.     ED Results / Procedures / Treatments   Labs (all labs ordered are listed, but only abnormal results are displayed) Labs Reviewed  BASIC METABOLIC PANEL  CBC WITH DIFFERENTIAL/PLATELET    EKG None  Radiology No results found.  Procedures Procedures (including critical care time)  Medications Ordered in ED Medications  sodium chloride 0.9 % bolus 1,000 mL (has no administration in time range)  meclizine (ANTIVERT) tablet 25 mg (has no administration in time range)    ED Course  I have reviewed the triage vital signs and the nursing notes.  Pertinent labs & imaging results that were available during my care of the patient were reviewed by me and considered in my medical decision making (see chart for details).    MDM Rules/Calculators/A&P  Patient presenting with complaints of dizziness and feeling as though something "popped" in his head.  His symptoms sound vertiginous in nature.  Patient given IV  fluids and meclizine.  He will go for an MRI to rule out stroke.  Care will be signed out to Dr. Alvino Chapel at shift change.  He will obtain the results of the MRI and determine the final disposition.  Final Clinical Impression(s) / ED Diagnoses Final diagnoses:  None    Rx / DC Orders ED Discharge Orders    None  Veryl Speak, MD 02/24/20 [phone]

## 2020-02-23 NOTE — ED Provider Notes (Signed)
  Physical Exam  BP 101/61   Pulse (!) 58   Temp 97.6 F (36.4 C) (Oral)   Resp 13   Ht 5\' 10"  (1.778 m)   Wt 102.1 kg   SpO2 97% Comment: room air  BMI 32.28 kg/m   Physical Exam  ED Course/Procedures     Procedures  MDM  See patient in signout.  Headache and vertigo with recent Covid.  MRI done and reassuring.  Patient feels better after Antivert.  Does have history of anxiety also.  Will discharge home with ENT follow-up as needed.       Davonna Belling, MD 02/23/20 1224

## 2020-02-23 NOTE — ED Notes (Signed)
Patient transported to MRI 

## 2020-02-25 ENCOUNTER — Telehealth: Payer: Self-pay | Admitting: Family Medicine

## 2020-02-25 NOTE — Telephone Encounter (Signed)
Patient has been having anxiety,he went to [facility] they prescribed medication he wants to know Dr.Parker thoughts on taking medication.I informed patient to take Rx that they had prescribed if he has any side effects  give [place] a call or UC. He will follow up with Dr,Parker Monday for concerns regarding Anxiety. He voices understanding.

## 2020-02-25 NOTE — Telephone Encounter (Signed)
Patient called in this afternoon wanting to speak with the nurse about a new medication he was prescribed for his anxiety and wanted to follow up with it, patient is scheduled for Monday at 11:40 am but would like to speak to someone today if possible.

## 2020-02-28 ENCOUNTER — Ambulatory Visit (INDEPENDENT_AMBULATORY_CARE_PROVIDER_SITE_OTHER): Payer: 59 | Admitting: Family Medicine

## 2020-02-28 DIAGNOSIS — F419 Anxiety disorder, unspecified: Secondary | ICD-10-CM | POA: Diagnosis not present

## 2020-02-28 DIAGNOSIS — G4700 Insomnia, unspecified: Secondary | ICD-10-CM

## 2020-02-28 MED ORDER — TRAZODONE HCL 50 MG PO TABS: 50.0000 mg | ORAL_TABLET | Freq: Every evening | ORAL | 3 refills | Status: DC | PRN

## 2020-02-28 NOTE — Progress Notes (Signed)
   Luis Delgado is a 54 y.o. male who presents today for a virtual office visit.  Assessment/Plan:  New/Acute Problems: COVID-19 Seems to be recovering well.  No current red flags.  Discussed with patient that lingering cough and brain fog may persist for several more weeks to months.  Discussed reasons to return to care and warning signs/symptoms.  Chronic Problems Addressed Today: Anxiety Worsening and now with insomnia.  He is very reluctant to start medications.  Given concurrent anxiety and insomnia, will start trazodone 50 mg nightly.  He can increase to 100 mg depending on response to 50 mg.  He will follow-up with me in a couple weeks via MyChart.  Hopefully insomnia and other psychiatric sequela of COVID-19 infection will improve over the next several weeks as his body recovers.     Subjective:  HPI:  Patient here to discuss anxiety.  He was diagnosed with Covid 15 days ago.  Had minimal symptoms at the time.  Since then he has had worsening anxiety and insomnia.  He is very concerned about potential sequela of Covid infection.  He has been to the emergency room twice in urgent care once over the last month or so.  Went to the emergency room after being diagnosed with Covid due to vertiginous symptoms.  Had MRI done which was negative.  He was started on meclizine which helped.  Couple days later started developing severe right ear pain.  Went to urgent care and was found to have cerumen impaction.  Had irrigation performed successfully.  The provider at the urgent care started him on hydroxyzine to use as needed for anxiety.  The first time he used this he had significant improvement was able to sleep through the night.  Second time use that he had difficulty sleeping.  He still has ongoing concern for healthcare anxiety most related to COVID-19.  He is currently seeing a therapist to help with his anxiety which seems to be helping.       Objective/Observations  Physical Exam: Gen:  NAD, resting comfortably Pulm: Normal work of breathing Neuro: Grossly normal, moves all extremities Psych: Normal affect and thought content  Virtual Visit via Video   I connected with [name] on 02/28/20 at 11:40 AM EST by a video enabled telemedicine application and verified that I am speaking with the correct person using two identifiers. The limitations of evaluation and management by telemedicine and the availability of in person appointments were discussed. The patient expressed understanding and agreed to proceed.   Patient location: Home Provider location: [facility] Persons participating in the virtual visit: Myself and Patient  Time Spent: 44 minutes of total time was spent on the date of the encounter performing the following actions: chart review prior to seeing the patient, obtaining history, performing a medically necessary exam, counseling on the treatment plan, placing orders, and documenting in our EHR.       Algis Greenhouse. Jerline Pain, MD 02/28/2020 12:26 PM

## 2020-02-28 NOTE — Assessment & Plan Note (Signed)
Worsening and now with insomnia.  He is very reluctant to start medications.  Given concurrent anxiety and insomnia, will start trazodone 50 mg nightly.  He can increase to 100 mg depending on response to 50 mg.  He will follow-up with me in a couple weeks via MyChart.  Hopefully insomnia and other psychiatric sequela of COVID-19 infection will improve over the next several weeks as his body recovers.

## 2020-03-01 ENCOUNTER — Encounter: Payer: Self-pay | Admitting: Family Medicine

## 2020-03-03 ENCOUNTER — Encounter: Payer: Self-pay | Admitting: Family Medicine

## 2020-03-03 ENCOUNTER — Other Ambulatory Visit: Payer: Self-pay

## 2020-03-03 ENCOUNTER — Encounter: Payer: Self-pay | Admitting: Physician Assistant

## 2020-03-03 ENCOUNTER — Ambulatory Visit (INDEPENDENT_AMBULATORY_CARE_PROVIDER_SITE_OTHER): Payer: 59 | Admitting: Physician Assistant

## 2020-03-03 VITALS — BP 140/80 | HR 82 | Temp 97.8°F | Ht 70.0 in | Wt 225.2 lb

## 2020-03-03 DIAGNOSIS — G4700 Insomnia, unspecified: Secondary | ICD-10-CM

## 2020-03-03 DIAGNOSIS — R1013 Epigastric pain: Secondary | ICD-10-CM | POA: Diagnosis not present

## 2020-03-03 DIAGNOSIS — F419 Anxiety disorder, unspecified: Secondary | ICD-10-CM

## 2020-03-03 DIAGNOSIS — R42 Dizziness and giddiness: Secondary | ICD-10-CM

## 2020-03-03 MED ORDER — PROPRANOLOL HCL 10 MG PO TABS: 5.0000 mg | ORAL_TABLET | Freq: Three times a day (TID) | ORAL | 0 refills | Status: DC | PRN

## 2020-03-03 MED ORDER — PANTOPRAZOLE SODIUM 40 MG PO TBEC: 40.0000 mg | DELAYED_RELEASE_TABLET | Freq: Every day | ORAL | 0 refills | Status: DC

## 2020-03-03 NOTE — Telephone Encounter (Signed)
Patient has been scheduled with Samantha for 03/03/20 @ 2pm

## 2020-03-03 NOTE — Patient Instructions (Signed)
It was great to see you!  Hold the lemon in your water. Start daily protonix (this has been sent in.)  Tonight, trial 100 mg trazodone.  If ineffective, may trial atarax tomorrow, may take 50 mg  Take care,  Inda Coke PA-C

## 2020-03-03 NOTE — Progress Notes (Addendum)
Luis Delgado is a 53 y.o. male here for a new problem.  I acted as a Education administrator for [employer], PA-C Anselmo Pickler, LPN  History of Present Illness:   Chief Complaint  Patient presents with  . Dizziness  . Insomnia  . Anxiety    HPI   Lightheadedness Pt c/o dizziness off and on since 02/23/2020. Went to ED was diagnosed with vertigo and given Meclizine took twice and stopped due to made him drowsy. Had MRI and labs in ER. MRI normal. Has a dull headache today starts in the base and come up. Denies vision changes, slurred speech, confusion. Took Ibuprofen last night.   [facility] and had a rapid covid test done today -- negative.  Insomnia Pt c/o insomnia for the past 8 nights, states that he has only slept one night. Last visit saw Dr. Jerline Pain 03/03 and was given Trazodone to sleep --  took a total of two nights and states that it only worked once, two nights ago. He took 50 mg the first night and then 75 mg the second night. He was told to increase to 100 mg and if this was unsuccessful, he could go back to Atarax. Patient has fears of mixing medications, even in they are a day apart. Feels like he can't shut his mind off because of his anxiety, fearful of losing control.   Does report that if he takes a nap he will sometime awake to catch his breath -- has never been tested for sleep apnea.  Does not drink caffeine or alcohol.  Anxiety Pt c/o increase in anxiety since saw Dr. Jerline Pain on 3/3. Has been on 10 mg propranolol prn for anxiety in the past, would like refill. Denies SI/HI.  Sees therapist weekly for the past 3 weeks. His ex-wife was historically his biggest support person and would help him cope however she is not as close to him. He denies concerns for bipolar disorder. Denies SI/HI.  GAD 7 : Generalized Anxiety Score 03/03/2020  Nervous, Anxious, on Edge 3  Control/stop worrying 3  Worry too much - different things 3  Trouble relaxing 3  Restless  3  Easily annoyed or irritable 1  Afraid - awful might happen 3  Total GAD 7 Score 19  Anxiety Difficulty Very difficult   Abdominal pain Feels like he might have stomach ulcer. Carries his anxiety in his stomach, per his report. Drinks a lot of water during the day but always has lemon in it. Was taking protonix and this was effective but stopped because he was afraid that it was interacting with his other medications.  Past Medical History:  Diagnosis Date  . Anxiety   . Kidney stone    x 1 episode      Social History   Socioeconomic History  . Marital status: Married    Spouse name: Not on file  . Number of children: Not on file  . Years of education: Not on file  . Highest education level: Not on file  Occupational History  . Not on file  Tobacco Use  . Smoking status: Never Smoker  . Smokeless tobacco: Never Used  Substance and Sexual Activity  . Alcohol use: No  . Drug use: No  . Sexual activity: Not on file  Other Topics Concern  . Not on file  Social History Narrative  . Not on file   Social Determinants of Health   Financial Resource Strain:   .  Difficulty of Paying Living Expenses: Not on file  Food Insecurity:   . Worried About Charity fundraiser in the Last Year: Not on file  . Ran Out of Food in the Last Year: Not on file  Transportation Needs:   . Lack of Transportation (Medical): Not on file  . Lack of Transportation (Non-Medical): Not on file  Physical Activity:   . Days of Exercise per Week: Not on file  . Minutes of Exercise per Session: Not on file  Stress:   . Feeling of Stress : Not on file  Social Connections:   . Frequency of Communication with Friends and Family: Not on file  . Frequency of Social Gatherings with Friends and Family: Not on file  . Attends Religious Services: Not on file  . Active Member of Clubs or Organizations: Not on file  . Attends Archivist Meetings: Not on file  . Marital Status: Not on file   Intimate Partner Violence:   . Fear of Current or Ex-Partner: Not on file  . Emotionally Abused: Not on file  . Physically Abused: Not on file  . Sexually Abused: Not on file    Past Surgical History:  Procedure Laterality Date  . KNEE SURGERY    . TONSILLECTOMY    . VASECTOMY    . VASECTOMY REVERSAL    . WISDOM TOOTH EXTRACTION      Family History  Problem Relation Age of Onset  . Colon cancer Neg Hx   . Colon polyps Neg Hx   . Esophageal cancer Neg Hx   . Rectal cancer Neg Hx   . Stomach cancer Neg Hx     No Known Allergies  Current Medications:   Current Outpatient Medications:  .  aspirin EC 81 MG tablet, Take 81 mg by mouth See admin instructions. Take 81 mg by mouth once daily four to five times a week, Disp: , Rfl:  .  Lysine 500 MG CAPS, Take 500 mg by mouth daily as needed (as directed- for flares of canker sores). , Disp: , Rfl:  .  Multiple Vitamins-Minerals (EMERGEN-C VITAMIN C PO), Take 1 packet by mouth daily. , Disp: , Rfl:  .  Multiple Vitamins-Minerals (MENS MULTIPLUS PO), Take 1 tablet by mouth daily with breakfast. , Disp: , Rfl:  .  Omega-3 Fatty Acids (FISH OIL) 1000 MG CAPS, Take 1,000 mg by mouth daily after breakfast. , Disp: , Rfl:  .  pantoprazole (PROTONIX) 40 MG tablet, Take 1 tablet (40 mg total) by mouth daily., Disp: 30 tablet, Rfl: 0 .  propranolol (INDERAL) 10 MG tablet, Take 0.5-1 tablets (5-10 mg total) by mouth 3 (three) times daily as needed (for anxiety)., Disp: 30 tablet, Rfl: 0 .  traZODone (DESYREL) 50 MG tablet, Take 1-2 tablets (50-100 mg total) by mouth at bedtime as needed for sleep., Disp: 30 tablet, Rfl: 3 .  meclizine (ANTIVERT) 25 MG tablet, Take 1 tablet (25 mg total) by mouth 3 (three) times daily as needed for dizziness. (Patient not taking: Reported on 03/03/2020), Disp: 30 tablet, Rfl: 0   Review of Systems:   ROS Negative unless otherwise specified per HPI.  Vitals:   Vitals:   03/03/20 1404  BP: 140/80  Pulse:  82  Temp: 97.8 F (36.6 C)  TempSrc: Temporal  SpO2: 96%  Weight: 225 lb 4 oz (102.2 kg)  Height: 5\' 10"  (1.778 m)     Body mass index is 32.32 kg/m.  Physical Exam:  Physical Exam Vitals and nursing note reviewed.  Constitutional:      General: He is not in acute distress.    Appearance: He is well-developed. He is not ill-appearing or toxic-appearing.  Cardiovascular:     Rate and Rhythm: Normal rate and regular rhythm.     Pulses: Normal pulses.     Heart sounds: Normal heart sounds, S1 normal and S2 normal.     Comments: No LE edema Pulmonary:     Effort: Pulmonary effort is normal.     Breath sounds: Normal breath sounds.  Abdominal:     General: Abdomen is flat. Bowel sounds are normal.     Palpations: Abdomen is soft.     Tenderness: There is abdominal tenderness in the epigastric area. There is no guarding or rebound.  Skin:    General: Skin is warm and dry.  Neurological:     General: No focal deficit present.     Mental Status: He is alert.     GCS: GCS eye subscore is 4. GCS verbal subscore is 5. GCS motor subscore is 6.     Cranial Nerves: Cranial nerves are intact.     Sensory: Sensation is intact.     Motor: Motor function is intact.     Coordination: Coordination is intact.     Gait: Gait is intact.  Psychiatric:        Mood and Affect: Mood is anxious.        Speech: Speech normal.        Behavior: Behavior normal. Behavior is cooperative.      Assessment and Plan:   Luis Delgado was seen today for dizziness, insomnia and anxiety.  Diagnoses and all orders for this visit:  Anxiety Uncontrolled. GAD-7 is 19. Refill of propranolol today. Continue therapy. Follow-up with PCP for further management/recommendations. I discussed with patient that if they develop any SI, to tell someone immediately and seek medical attention.  Insomnia, unspecified type Uncontrolled. Trial 100 mg trazodone. If ineffective may trial 50 mg atarax instead on the next night.  Recommended that he reach out to his PCP if symptoms persist. Could consider increasing atarax to max dose of 100 mg daily, ordering sleep study. Suspect as anxiety improves, his insomnia will improve.  Lightheadedness EKG performed per patient request. EKG tracing is personally reviewed.  EKG notes NSR.  No acute changes. Suspect symptoms are related to lack of sleep, variable nutrition and anxiety. Was offered ENT referral in ER, he declines this at this time. Provided reassurance. Worsening precautions advised. -     EKG 12-Lead  Epigastric pain Suspect gastritis, resume protonix. Avoid acidic foods/beverages. Follow-up with PCP if symptoms persist/worsen.  Other orders -     propranolol (INDERAL) 10 MG tablet; Take 0.5-1 tablets (5-10 mg total) by mouth 3 (three) times daily as needed (for anxiety). -     pantoprazole (PROTONIX) 40 MG tablet; Take 1 tablet (40 mg total) by mouth daily.  I spent 45 minutes with this patient, greater than 50% was face-to-face time counseling regarding the above diagnoses.  . Reviewed expectations re: course of current medical issues. . Discussed self-management of symptoms. . Outlined signs and symptoms indicating need for more acute intervention. . Patient verbalized understanding and all questions were answered. . See orders for this visit as documented in the electronic medical record. . Patient received an After-Visit Summary.  CMA or LPN served as scribe during this visit. History, Physical, and Plan performed by medical provider. The above  documentation has been reviewed and is accurate and complete.   Inda Coke, PA-C

## 2020-03-03 NOTE — Telephone Encounter (Signed)
Please call pt and schedule appointment

## 2020-03-05 ENCOUNTER — Encounter: Payer: Self-pay | Admitting: Family Medicine

## 2020-03-05 ENCOUNTER — Encounter: Payer: Self-pay | Admitting: Physician Assistant

## 2020-03-06 ENCOUNTER — Other Ambulatory Visit: Payer: Self-pay | Admitting: Family Medicine

## 2020-03-06 ENCOUNTER — Encounter: Payer: Self-pay | Admitting: Physician Assistant

## 2020-03-06 ENCOUNTER — Encounter: Payer: Self-pay | Admitting: Family Medicine

## 2020-03-06 NOTE — Telephone Encounter (Signed)
Added from another message.  One other point - I am also somewhat lightheaded during the day even though I am taking the medication at night

## 2020-03-06 NOTE — Telephone Encounter (Signed)
Added to another open my chart message.

## 2020-03-07 NOTE — Telephone Encounter (Signed)
Patient stated he does not want to stop taking rx he will take 25 MG of Trazodone and let us know how its working.

## 2020-03-13 ENCOUNTER — Encounter: Payer: Self-pay | Admitting: Family Medicine

## 2020-03-13 ENCOUNTER — Encounter: Payer: Self-pay | Admitting: Physician Assistant

## 2020-03-13 ENCOUNTER — Telehealth: Payer: Self-pay

## 2020-03-13 NOTE — Telephone Encounter (Signed)
Patient sent a message earlier regarding sleep  medication that he stop taking and wanted to what medication could he take tonight. Schedule OV 03/17/20 at 8:20am. Pt requesting call back by the end of today

## 2020-03-13 NOTE — Telephone Encounter (Signed)
Please make appointment for patient to be seen in office.

## 2020-03-14 ENCOUNTER — Other Ambulatory Visit: Payer: Self-pay

## 2020-03-14 MED ORDER — HYDROXYZINE HCL 25 MG PO TABS: 50.0000 mg | ORAL_TABLET | Freq: Every evening | ORAL | 0 refills | Status: DC | PRN

## 2020-03-14 NOTE — Telephone Encounter (Signed)
[facility] with sending in hydroxyzine 50mg  nightly.  Algis Greenhouse. Jerline Pain, MD 03/14/2020 9:44 AM

## 2020-03-14 NOTE — Telephone Encounter (Signed)
Please advise 

## 2020-03-14 NOTE — Telephone Encounter (Signed)
Rx sent. Patient stated he will not take medications,he sleep fine last night.

## 2020-03-14 NOTE — Telephone Encounter (Signed)
Responded to Patient, see phone call note

## 2020-03-17 ENCOUNTER — Other Ambulatory Visit: Payer: Self-pay

## 2020-03-17 ENCOUNTER — Encounter: Payer: Self-pay | Admitting: Family Medicine

## 2020-03-17 ENCOUNTER — Ambulatory Visit (INDEPENDENT_AMBULATORY_CARE_PROVIDER_SITE_OTHER): Payer: 59 | Admitting: Family Medicine

## 2020-03-17 VITALS — BP 118/72 | HR 71 | Temp 97.2°F | Ht 70.0 in | Wt 217.0 lb

## 2020-03-17 DIAGNOSIS — G4700 Insomnia, unspecified: Secondary | ICD-10-CM | POA: Insufficient documentation

## 2020-03-17 DIAGNOSIS — F419 Anxiety disorder, unspecified: Secondary | ICD-10-CM | POA: Diagnosis not present

## 2020-03-17 MED ORDER — ZOLPIDEM TARTRATE 10 MG PO TABS: 10.0000 mg | ORAL_TABLET | Freq: Every evening | ORAL | 1 refills | Status: DC | PRN

## 2020-03-17 NOTE — Assessment & Plan Note (Signed)
We will stop trazodone and hydroxyzine.  Will start Ambien as this is worked well in the past for discussed potential side effects.  He will follow-up with me in a couple weeks via MyChart.

## 2020-03-17 NOTE — Assessment & Plan Note (Signed)
Stable.  He will continue working with the therapist.

## 2020-03-17 NOTE — Progress Notes (Signed)
   Luis Delgado is a 54 y.o. male who presents today for an office visit.  Assessment/Plan:  Chronic Problems Addressed Today: Insomnia We will stop trazodone and hydroxyzine.  Will start Ambien as this is worked well in the past for discussed potential side effects.  He will follow-up with me in a couple weeks via MyChart.  Anxiety Stable.  He will continue working with the therapist.     Subjective:  HPI:  Patient is here for insomnia follow-up.  Last seen a few weeks ago for this.  We have tried multiple medications including trazodone and hydroxyzine.  Neither of these have been particularly effective for more than a day or 2.  He stopped all the medications over the last few days.  Yesterday he went to sleep around 7 PM and did not wake up until 7 AM.  He has been working with his therapist which seems to be going well.  Is been under increased stress due to issues at home and work.  He has tried insomnia in the past no bolus.       Objective:  Physical Exam: BP 118/72   Pulse 71   Temp (!) 97.2 F (36.2 C)   Ht 5\' 10"  (1.778 m)   Wt 217 lb (98.4 kg)   SpO2 99%   BMI 31.14 kg/m   Gen: No acute distress, resting comfortably CV: Regular rate and rhythm with no murmurs appreciated Pulm: Normal work of breathing, clear to auscultation bilaterally with no crackles, wheezes, or rhonchi Neuro: Grossly normal, moves all extremities Psych: Normal affect and thought content  Time Spent: 43 minutes of total time was spent on the date of the encounter performing the following actions: chart review prior to seeing the patient, obtaining history, performing a medically necessary exam, counseling on the treatment plan, placing orders, and documenting in our EHR.        Algis Greenhouse. Jerline Pain, MD 03/17/2020 9:04 AM

## 2020-03-17 NOTE — Patient Instructions (Signed)
It was very nice to see you today!  We will start ambien.  Please take half of a tablet 30 minutes to an hour before you try to get sleep.  Let me know how things are going in the next 1 to 2 weeks.  Take care, Dr Jerline Pain  Please try these tips to maintain a healthy lifestyle:   Eat at least 3 REAL meals and 1-2 snacks per day.  Aim for no more than 5 hours between eating.  If you eat breakfast, please do so within one hour of getting up.    Each meal should contain half fruits/vegetables, one quarter protein, and one quarter carbs (no bigger than a computer mouse)   Cut down on sweet beverages. This includes juice, soda, and sweet tea.     Drink at least 1 glass of water with each meal and aim for at least 8 glasses per day   Exercise at least 150 minutes every week.

## 2020-03-20 NOTE — Telephone Encounter (Signed)
Left voice message to pt  Advised to call insurance for more information about Massage therapy  Call our office if any question

## 2020-03-22 ENCOUNTER — Encounter: Payer: Self-pay | Admitting: Family Medicine

## 2020-03-23 ENCOUNTER — Encounter: Payer: Self-pay | Admitting: Family Medicine

## 2020-03-24 ENCOUNTER — Telehealth: Payer: Self-pay | Admitting: *Deleted

## 2020-03-24 NOTE — Telephone Encounter (Signed)
Ok for him to continue hydroxzine 50mg  nightly.  Algis Greenhouse. Jerline Pain, MD 03/24/2020 3:28 PM

## 2020-03-24 NOTE — Telephone Encounter (Signed)
Patient notified to continue 50 MG Of Hydroxyzine. He voices understanding.

## 2020-03-24 NOTE — Telephone Encounter (Signed)
Pt stated took Rx Hydroxyzine 50mg  at bed time and slept very good, it work find. Pt requesting advised if he needs to continue with Rx hydroxyzine 50mg  or he should be taking 25mg   Please advised

## 2020-03-25 ENCOUNTER — Other Ambulatory Visit: Payer: Self-pay | Admitting: Physician Assistant

## 2020-03-28 ENCOUNTER — Encounter: Payer: Self-pay | Admitting: Family Medicine

## 2020-03-28 NOTE — Telephone Encounter (Signed)
Please advise 

## 2020-03-29 ENCOUNTER — Other Ambulatory Visit: Payer: Self-pay

## 2020-03-29 MED ORDER — HYDROXYZINE HCL 25 MG PO TABS: 50.0000 mg | ORAL_TABLET | Freq: Every evening | ORAL | 2 refills | Status: DC | PRN

## 2020-04-26 ENCOUNTER — Other Ambulatory Visit: Payer: Self-pay | Admitting: Physician Assistant

## 2020-05-26 ENCOUNTER — Other Ambulatory Visit: Payer: Self-pay | Admitting: Family Medicine

## 2020-05-30 ENCOUNTER — Other Ambulatory Visit: Payer: Self-pay | Admitting: Family Medicine

## 2020-06-17 ENCOUNTER — Other Ambulatory Visit: Payer: Self-pay | Admitting: Family Medicine

## 2020-07-13 ENCOUNTER — Other Ambulatory Visit: Payer: Self-pay | Admitting: Family Medicine

## 2020-08-13 ENCOUNTER — Other Ambulatory Visit: Payer: Self-pay | Admitting: Family Medicine

## 2020-09-10 ENCOUNTER — Other Ambulatory Visit: Payer: Self-pay | Admitting: Family Medicine

## 2020-09-20 ENCOUNTER — Encounter: Payer: Self-pay | Admitting: Family Medicine

## 2020-09-20 NOTE — Telephone Encounter (Signed)
Pt requesting refills.

## 2020-09-23 ENCOUNTER — Other Ambulatory Visit: Payer: Self-pay | Admitting: *Deleted

## 2020-09-23 MED ORDER — HYDROXYZINE HCL 25 MG PO TABS: 50.0000 mg | ORAL_TABLET | Freq: Every evening | ORAL | 0 refills | Status: AC | PRN

## 2020-09-23 NOTE — Telephone Encounter (Signed)
Spoke to pt clarified Rx he wanted was Hydroxyzine 25 mg? Pt said yes, he is going on a trip and like to have it just incase he needs it. Told pt okay will send Rx to pharmacy. Pt verbalized understanding.

## 2020-10-15 ENCOUNTER — Other Ambulatory Visit: Payer: Self-pay | Admitting: Family Medicine

## 2020-10-30 ENCOUNTER — Other Ambulatory Visit: Payer: Self-pay | Admitting: Family Medicine

## 2021-03-13 IMAGING — MR MR HEAD W/O CM
12 of 13 series · 44 of 48 positions shown · non-contrast
Comparison: None.

CLINICAL DATA: Ataxia headache rule out stroke

EXAM:
MRI HEAD WITHOUT CONTRAST
TECHNIQUE: Multiplanar, multiecho pulse sequences of the brain and surrounding
structures were obtained without intravenous contrast.

[Series 5: DWI · axial · 3.0mm · 0.88mm/px · z∈[-51,+92]mm · 7 of 100 slices shown (1 of 4)]
[im 1/100]
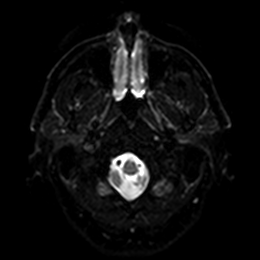
[im 17/100]
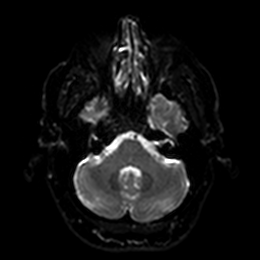
[im 34/100]
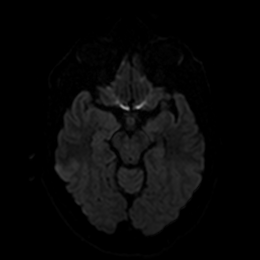
[im 50/100]
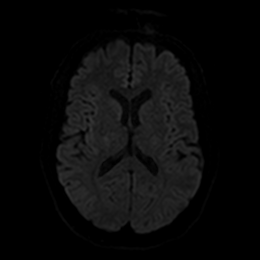
[im 67/100]
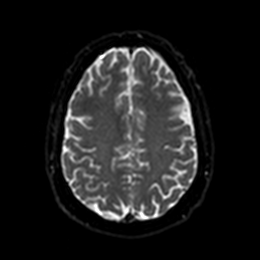
[im 83/100]
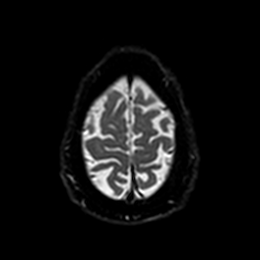
[im 100/100]
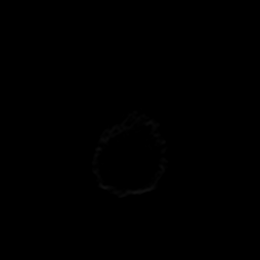

[Series 6: DWI · axial · 3.0mm · 0.88mm/px · z∈[-51,+92]mm · 4 of 50 slices shown (2 of 4)]
[im 1/50]
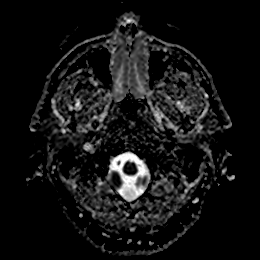
[im 17/50]
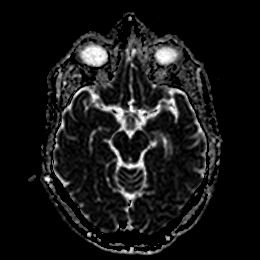
[im 33/50]
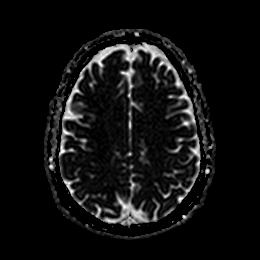
[im 50/50]
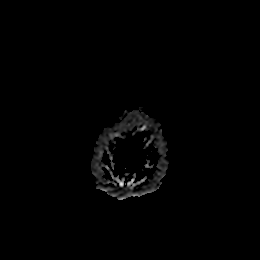

[Series 7: DWI · coronal · 4.0mm · 0.88mm/px · 6 of 76 slices shown (3 of 4)]
[im 1/76]
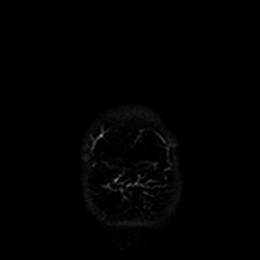
[im 16/76]
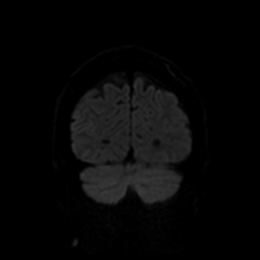
[im 31/76]
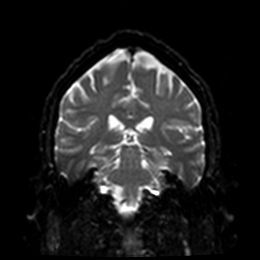
[im 46/76]
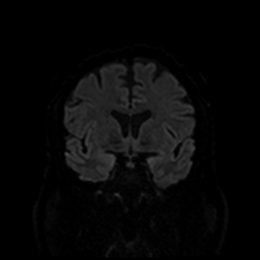
[im 61/76]
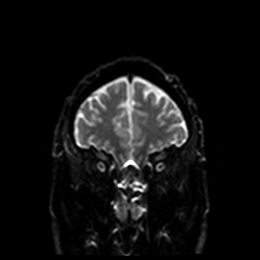
[im 76/76]
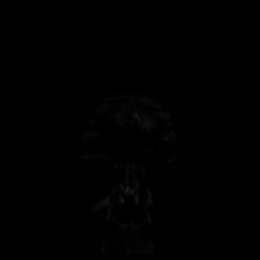

[Series 8: DWI · coronal · 4.0mm · 0.88mm/px · 3 of 38 slices shown (4 of 4)]
[im 1/38]
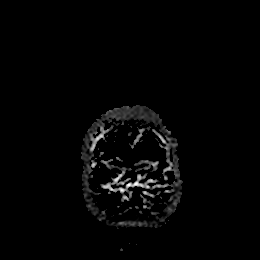
[im 19/38]
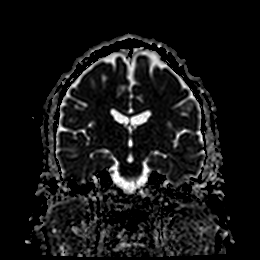
[im 38/38]
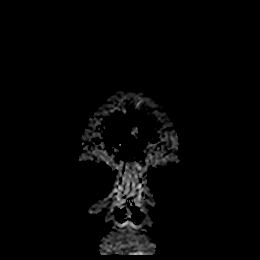

[Series 9: T1 · sagittal · 5.0mm · 0.75mm/px · 2 of 23 slices shown]
[im 1/23]
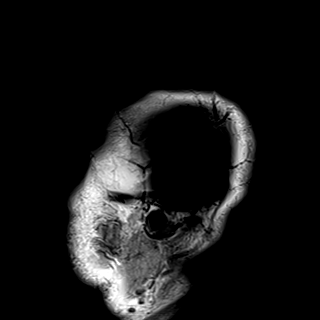
[im 23/23]
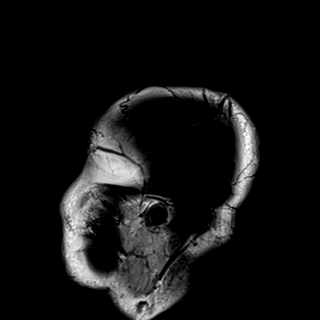

[Series 10: T2 · axial · 5.0mm · 0.72mm/px · z∈[-53,+87]mm · 2 of 25 slices shown (1 of 2)]
[im 1/25]
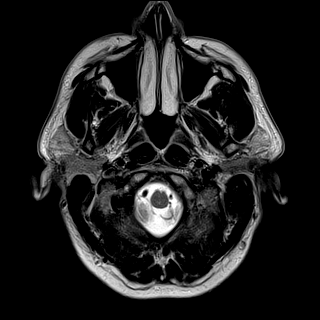
[im 25/25]
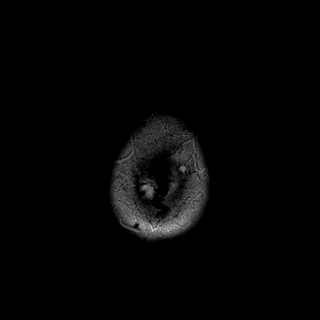

[Series 11: FLAIR · axial · 5.0mm · 0.45mm/px · z∈[-53,+87]mm · 2 of 25 slices shown]
[im 1/25]
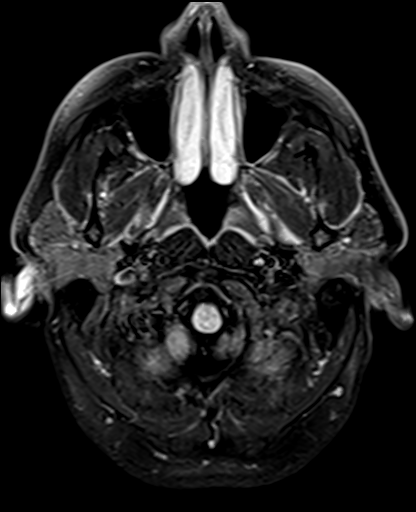
[im 25/25]
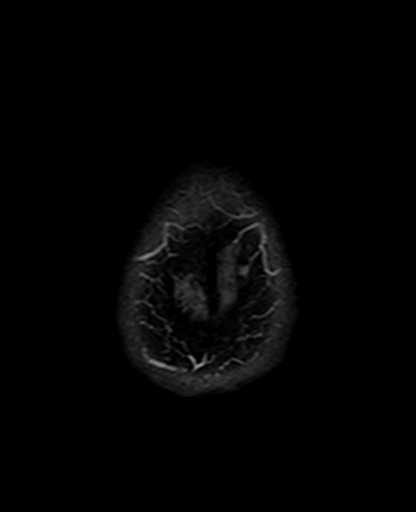

[Series 12: mag_images · axial · 3.0mm · 0.90mm/px · z∈[-66,+106]mm · 4 of 60 slices shown]
[im 1/60]
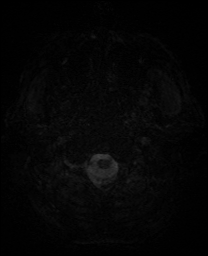
[im 20/60]
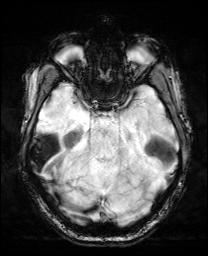
[im 40/60]
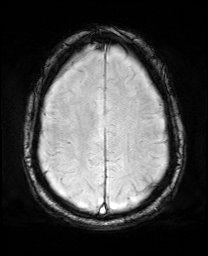
[im 60/60]
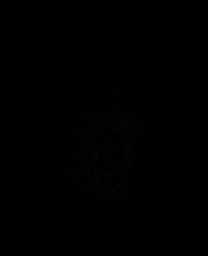

[Series 13: pha_images · axial · 3.0mm · 0.90mm/px · z∈[-66,+103]mm · 4 of 59 slices shown]
[im 1/59]
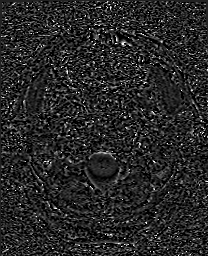
[im 20/59]
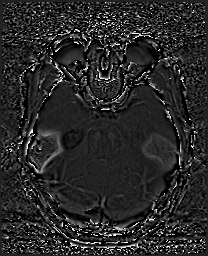
[im 39/59]
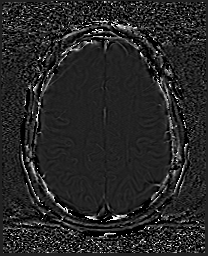
[im 59/59]
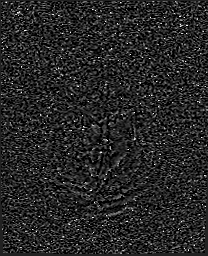

[Series 14: swi_images · axial · 3.0mm · 0.90mm/px · z∈[-66,+106]mm · 4 of 60 slices shown]
[im 1/60]
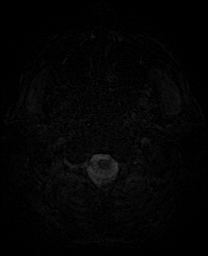
[im 20/60]
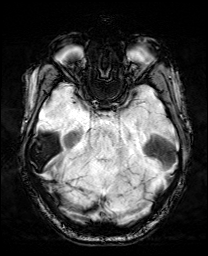
[im 40/60]
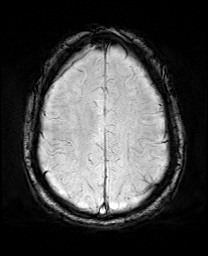
[im 60/60]
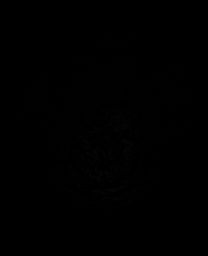

[Series 15: mip_images(sw) · axial · 24.0mm · 0.90mm/px · z∈[-56,+96]mm · 4 of 53 slices shown]
[im 1/53]
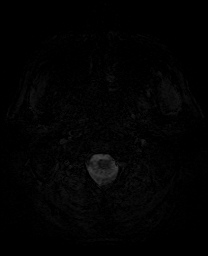
[im 18/53]
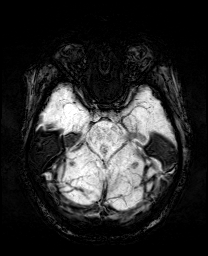
[im 35/53]
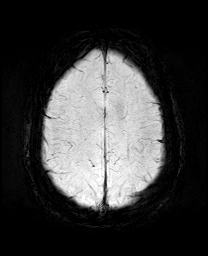
[im 53/53]
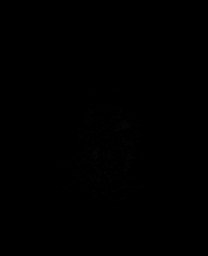

[Series 17: T2 · coronal · 5.0mm · 0.34mm/px · 2 of 29 slices shown (2 of 2)]
[im 1/29]
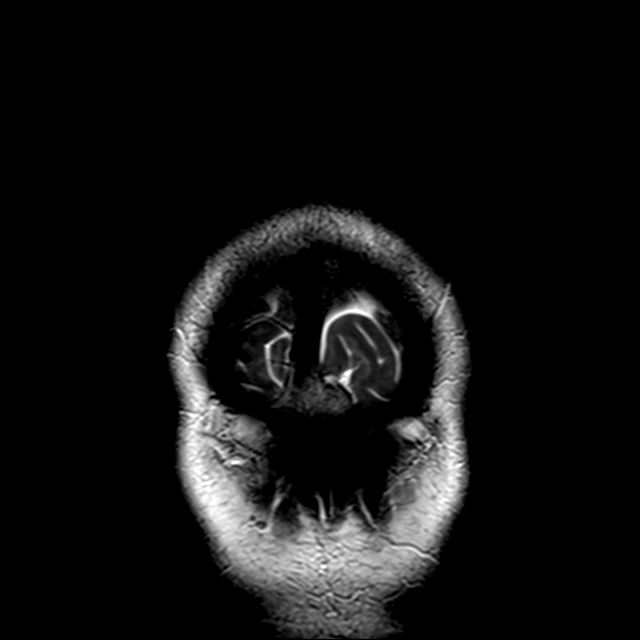
[im 29/29]
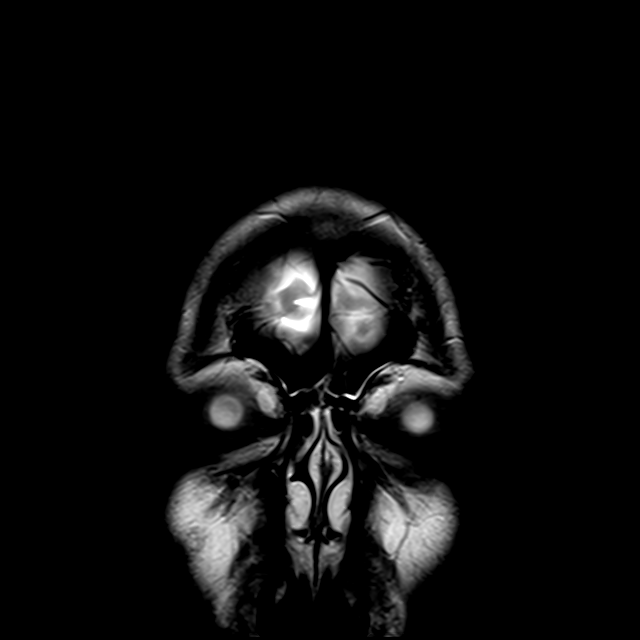

[44 of 48 positions shown; findings below may reference images not displayed]

FINDINGS: Brain: No acute infarction, hemorrhage, hydrocephalus, extra-axial
collection or mass lesion. Normal white matter

Vascular: Normal arterial flow voids

Skull and upper cervical spine: No focal skeletal lesion.

Sinuses/Orbits: Mild mucosal edema paranasal sinuses. Negative orbit

Other: None
IMPRESSION: Negative MRI brain without contrast.

## 2022-03-21 ENCOUNTER — Ambulatory Visit (INDEPENDENT_AMBULATORY_CARE_PROVIDER_SITE_OTHER): Payer: 59 | Admitting: Family Medicine

## 2022-03-21 VITALS — BP 145/82 | HR 64 | Temp 98.2°F | Ht 70.0 in | Wt 249.4 lb

## 2022-03-21 DIAGNOSIS — R519 Headache, unspecified: Secondary | ICD-10-CM | POA: Diagnosis not present

## 2022-03-21 DIAGNOSIS — R69 Illness, unspecified: Secondary | ICD-10-CM | POA: Diagnosis not present

## 2022-03-21 DIAGNOSIS — F419 Anxiety disorder, unspecified: Secondary | ICD-10-CM

## 2022-03-21 DIAGNOSIS — G4700 Insomnia, unspecified: Secondary | ICD-10-CM | POA: Diagnosis not present

## 2022-03-21 NOTE — Assessment & Plan Note (Signed)
Likely contributing to his headache and vertigo symptoms.  Not currently on any medications though does take melatonin months needed to help with sleep.  He has worked with a Transport planner in the past.  He will let me know if he needs any further assistance. ?

## 2022-03-21 NOTE — Patient Instructions (Addendum)
It was very nice to see you today! ? ?I think you are probably having vertigo.  Stress could be contributing to this.  We will check an MRI to make sure there is nothing else going on. ? ?Please make sure that you are taking a fiber supplement to help with the hemorrhoids. ? ?Take care, ?Dr Jerline Pain ? ?PLEASE NOTE: ? ?If you had any lab tests please let us know if you have not heard back within a few days. You may see your results on mychart before we have a chance to review them but we will give you a call once they are reviewed by [place]. If we ordered any referrals today, please let us know if you have not heard from their office within the next week.  ? ?Please try these tips to maintain a healthy lifestyle: ? ?Eat at least 3 REAL meals and 1-2 snacks per day.  Aim for no more than 5 hours between eating.  If you eat breakfast, please do so within one hour of getting up.  ? ?Each meal should contain half fruits/vegetables, one quarter protein, and one quarter carbs (no bigger than a computer mouse) ? ?Cut down on sweet beverages. This includes juice, soda, and sweet tea.  ? ?Drink at least 1 glass of water with each meal and aim for at least 8 glasses per day ? ?Exercise at least 150 minutes every week.   ?

## 2022-03-21 NOTE — Assessment & Plan Note (Signed)
Uses melatonin as needed which works well. ?

## 2022-03-21 NOTE — Progress Notes (Signed)
? ?Luis Delgado is a 56 y.o. male who presents today for an office visit. ? ?Assessment/Plan:  ?New/Acute Problems: ?Headache / Dizziness ?He does have a bit of lateral nystagmus on exam and probably has some underlying vertigo.  His neuro exam otherwise today is negative without any obvious deficits.  He has quite a bit of stress which is likely contributing as well.  He will be leaving the country for several months in a couple of weeks.  Given his new onset headache in addition to dizziness we will check an MRI to rule out any other possible causes lowsuspicion for this is relatively low.  He will continue to work on Child psychotherapist.  Encouraged hydration.  We discussed reasons return to care. ? ?Internal hemorrhoids ?Found on colonoscopy a few years ago.  Recently worsened since being back in [place]. We discussed fiber supplementation and avoidance of prolonged sitting on the toilet. ? ?Chronic Problems Addressed Today: ?Anxiety ?Likely contributing to his headache and vertigo symptoms.  Not currently on any medications though does take melatonin months needed to help with sleep.  He has worked with a Transport planner in the past.  He will let me know if he needs any further assistance. ? ?Insomnia ?Uses melatonin as needed which works well. ? ?  ?Subjective:  ?HPI: ? ?Patient here with persistent headaches. Started about couple of weeks ago.  Symptoms started suddenly whens he was lifting weight at gym where he felt some head pressure a couple of weeks ago. He has been experiencing headache with dizziness. He has been feeling off balance. Sometimes feel like he will pass out. Some room spinning.  Did not lose consciousness. No vision changes. He has had a lot of increased stress due to work and thinks this could be contributing as well. He has had issue with dizziness in the past but was not diagnosed with anything that he is aware of. He did have MRI in the past which was negative. No treatment tried. No chest  pain or shortness of breath. Some nausea but denies vomiting. No weakness or numbness. No injuries. No other precipating events.  ? ?He has had issue with anxiety in the past. This is mostly related to his work. His mother passed out and he has been under stress due to this as well. He has been exercising to help with the symptoms. He has had issue with sleep. He takes Melatonin and this seems to be helping. Denies SI or HI.  ? ?He also complain of hemorrhoids flare up. Comes and goes. He has tried some medication which seems to be helping.  ? ?   ?  ?Objective:  ?Physical Exam: ?BP (!) 145/82 (BP Location: Left Arm)   Pulse 64   Temp 98.2 ?F (36.8 ?C) (Temporal)   Ht '5\' 10"'$  (1.778 m)   Wt 249 lb 6.4 oz (113.1 kg)   SpO2 98%   BMI 35.79 kg/m?   ?Gen: No acute distress, resting comfortably ?CV: Regular rate and rhythm with no murmurs appreciated ?Pulm: Normal work of breathing, clear to auscultation bilaterally with no crackles, wheezes, or rhonchi ?Neuro: Cranial nerves II through XII intact.  Finger-nose-finger testing intact bilaterally.  Slight lateral nystagmus noted with eye movements.  Dix-Hallpike deferred.  Strength out of 5 in upper and lower extremities.  Sensation light touch intact throughout. ?Psych: Normal affect and thought content ? ?   ? ? ?I,Savera Zaman,acting as a Education administrator for Dimas Chyle, MD.,have documented all relevant documentation on  the behalf of Dimas Chyle, MD,as directed by  Dimas Chyle, MD while in the presence of Dimas Chyle, MD.  ? ?I, Dimas Chyle, MD, have reviewed all documentation for this visit. The documentation on 03/21/22 for the exam, diagnosis, procedures, and orders are all accurate and complete. ? ?Time Spent: ?30 minutes of total time was spent on the date of the encounter performing the following actions: chart review prior to seeing the patient, obtaining history, performing a medically necessary exam, counseling on the treatment plan including need for imaging,  placing orders, and documenting in our EHR.  ? ? ?Algis Greenhouse. Jerline Pain, MD ?03/21/2022 10:18 AM  ? ?

## 2022-03-22 ENCOUNTER — Telehealth: Payer: Self-pay | Admitting: Physician Assistant

## 2022-03-22 NOTE — Telephone Encounter (Signed)
I was notified by our referral coordinator that pt is experiencing severe headache and the dizziness has worsened. Seen by PCP on 3/23. I reviewed notes. ? ?MRI approval is still pending. ? ?Referral coordinator states that patient reports that he feels that waiting until next week is worrisome since he is having trouble leaving his house due to this. ? ?Please call patient and let him know that at this time, we are unable to change this order to stat and will need to proceed to the ER given worsening symptoms. ? ?Inda Coke PA-C ?

## 2022-03-22 NOTE — Telephone Encounter (Signed)
Patient is aware of Luis Delgado, [place] recommendations listed below. He states if his symptoms worsen, he will go to the ED, otherwise he will wait until we get approval from insurance for MRI.  ?

## 2022-03-28 DIAGNOSIS — R519 Headache, unspecified: Secondary | ICD-10-CM | POA: Diagnosis not present

## 2022-03-28 DIAGNOSIS — R42 Dizziness and giddiness: Secondary | ICD-10-CM | POA: Diagnosis not present

## 2022-03-29 ENCOUNTER — Ambulatory Visit (HOSPITAL_BASED_OUTPATIENT_CLINIC_OR_DEPARTMENT_OTHER): Payer: 59

## 2022-04-02 ENCOUNTER — Telehealth: Payer: Self-pay

## 2022-04-02 NOTE — Telephone Encounter (Signed)
Left voice massage with MRI negative results. If any question please give [place] a call  ?

## 2022-04-02 NOTE — Telephone Encounter (Signed)
Would like a call back in reference to CT scan completed at [facility].   Would like results as soon as possible as he will be traveling back to [place].

## 2022-04-02 NOTE — Telephone Encounter (Signed)
I received MRI results via fax that everything was NORMAL. No abnormalities.  ? ?Algis Greenhouse. Jerline Pain, MD ?04/02/2022 3:41 PM  ? ?

## 2022-04-02 NOTE — Telephone Encounter (Signed)
CT Head faxed received  ?

## 2022-08-15 ENCOUNTER — Ambulatory Visit (INDEPENDENT_AMBULATORY_CARE_PROVIDER_SITE_OTHER): Payer: 59 | Admitting: Family Medicine

## 2022-08-15 ENCOUNTER — Encounter: Payer: Self-pay | Admitting: Family Medicine

## 2022-08-15 VITALS — BP 137/86 | HR 79 | Temp 98.3°F | Ht 70.0 in | Wt 244.2 lb

## 2022-08-15 DIAGNOSIS — M62838 Other muscle spasm: Secondary | ICD-10-CM

## 2022-08-15 DIAGNOSIS — F419 Anxiety disorder, unspecified: Secondary | ICD-10-CM

## 2022-08-15 DIAGNOSIS — Z23 Encounter for immunization: Secondary | ICD-10-CM

## 2022-08-15 DIAGNOSIS — R69 Illness, unspecified: Secondary | ICD-10-CM | POA: Diagnosis not present

## 2022-08-15 DIAGNOSIS — R739 Hyperglycemia, unspecified: Secondary | ICD-10-CM

## 2022-08-15 DIAGNOSIS — Z13220 Encounter for screening for lipoid disorders: Secondary | ICD-10-CM

## 2022-08-15 DIAGNOSIS — Z125 Encounter for screening for malignant neoplasm of prostate: Secondary | ICD-10-CM | POA: Diagnosis not present

## 2022-08-15 LAB — COMPREHENSIVE METABOLIC PANEL
ALT: 70 U/L — ABNORMAL HIGH (ref 0–53)
AST: 40 U/L — ABNORMAL HIGH (ref 0–37)
Albumin: 4.7 g/dL (ref 3.5–5.2)
Alkaline Phosphatase: 90 U/L (ref 39–117)
BUN: 17 mg/dL (ref 6–23)
CO2: 25 mEq/L (ref 19–32)
Calcium: 9.4 mg/dL (ref 8.4–10.5)
Chloride: 105 mEq/L (ref 96–112)
Creatinine, Ser: 1.1 mg/dL (ref 0.40–1.50)
GFR: 75.69 mL/min (ref >60.00)
Glucose, Bld: 97 mg/dL (ref 70–99)
Potassium: 5 mEq/L (ref 3.5–5.1)
Sodium: 137 mEq/L (ref 135–145)
Total Bilirubin: 0.7 mg/dL (ref 0.2–1.2)
Total Protein: 7.2 g/dL (ref 6.0–8.3)

## 2022-08-15 LAB — LIPID PANEL
Cholesterol: 210 mg/dL — ABNORMAL HIGH (ref 0–200)
HDL: 59.4 mg/dL (ref >39.00)
LDL Cholesterol: 128 mg/dL — ABNORMAL HIGH (ref 0–99)
NonHDL: 151.05
Total CHOL/HDL Ratio: 4
Triglycerides: 113 mg/dL (ref 0.0–149.0)
VLDL: 22.6 mg/dL (ref 0.0–40.0)

## 2022-08-15 LAB — CBC
HCT: 47.6 % (ref 39.0–52.0)
Hemoglobin: 16 g/dL (ref 13.0–17.0)
MCHC: 33.6 g/dL (ref 30.0–36.0)
MCV: 89.7 fl (ref 78.0–100.0)
Platelets: 186 10*3/uL (ref 150.0–400.0)
RBC: 5.31 Mil/uL (ref 4.22–5.81)
RDW: 14 % (ref 11.5–15.5)
WBC: 6.3 10*3/uL (ref 4.0–10.5)

## 2022-08-15 LAB — VITAMIN B12: Vitamin B-12: 431 pg/mL (ref 211–911)

## 2022-08-15 LAB — TSH: TSH: 3.87 u[IU]/mL (ref 0.35–5.50)

## 2022-08-15 LAB — HEMOGLOBIN A1C: Hgb A1c MFr Bld: 6.2 % (ref 4.6–6.5)

## 2022-08-15 LAB — MAGNESIUM: Magnesium: 2.1 mg/dL (ref 1.5–2.5)

## 2022-08-15 LAB — PSA: PSA: 0.91 ng/mL (ref 0.10–4.00)
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
## 2022-08-15 NOTE — Assessment & Plan Note (Signed)
Check A1c. 

## 2022-08-15 NOTE — Assessment & Plan Note (Signed)
Stable.  Probably contributing to his muscle spasms.  Not currently on any medications.

## 2022-08-15 NOTE — Addendum Note (Signed)
Addended by: Betti Cruz on: 08/15/2022 09:34 AM   Modules accepted: Orders
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
## 2022-08-16 NOTE — Progress Notes (Signed)
Please inform patient of the following:  Cholesterol and blood sugar are both borderline elevated.  Do not need to start meds but he should continue to work on diet and exercise.  His liver numbers are up a little bit. Can we have him come back in 1-2 weeks to recheck? Please place future order for CMET.   Everything else is stable. Would like for him to take the supplements we discussed and let us know if his spasms are not improving.  Algis Greenhouse. Jerline Pain, MD 08/16/2022 1:07 PM

## 2022-08-20 ENCOUNTER — Other Ambulatory Visit: Payer: Self-pay | Admitting: *Deleted

## 2022-08-20 DIAGNOSIS — R748 Abnormal levels of other serum enzymes: Secondary | ICD-10-CM

## 2022-09-17 DIAGNOSIS — M9903 Segmental and somatic dysfunction of lumbar region: Secondary | ICD-10-CM | POA: Diagnosis not present

## 2022-09-17 DIAGNOSIS — M50122 Cervical disc disorder at C5-C6 level with radiculopathy: Secondary | ICD-10-CM | POA: Diagnosis not present

## 2022-09-17 DIAGNOSIS — M9901 Segmental and somatic dysfunction of cervical region: Secondary | ICD-10-CM | POA: Diagnosis not present

## 2022-09-17 DIAGNOSIS — M5137 Other intervertebral disc degeneration, lumbosacral region: Secondary | ICD-10-CM | POA: Diagnosis not present

## 2022-09-19 DIAGNOSIS — M50122 Cervical disc disorder at C5-C6 level with radiculopathy: Secondary | ICD-10-CM | POA: Diagnosis not present

## 2022-09-19 DIAGNOSIS — M5137 Other intervertebral disc degeneration, lumbosacral region: Secondary | ICD-10-CM | POA: Diagnosis not present

## 2022-09-19 DIAGNOSIS — M9901 Segmental and somatic dysfunction of cervical region: Secondary | ICD-10-CM | POA: Diagnosis not present

## 2022-09-19 DIAGNOSIS — M9903 Segmental and somatic dysfunction of lumbar region: Secondary | ICD-10-CM | POA: Diagnosis not present

## 2023-01-02 ENCOUNTER — Encounter: Payer: Self-pay | Admitting: Family Medicine

## 2023-01-02 ENCOUNTER — Ambulatory Visit: Payer: 59 | Admitting: Family Medicine

## 2023-01-02 VITALS — BP 124/76 | HR 80 | Temp 95.5°F | Ht 70.0 in | Wt 250.6 lb

## 2023-01-02 DIAGNOSIS — M62838 Other muscle spasm: Secondary | ICD-10-CM | POA: Diagnosis not present

## 2023-01-02 DIAGNOSIS — R739 Hyperglycemia, unspecified: Secondary | ICD-10-CM

## 2023-01-02 DIAGNOSIS — F419 Anxiety disorder, unspecified: Secondary | ICD-10-CM | POA: Diagnosis not present

## 2023-01-02 LAB — COMPREHENSIVE METABOLIC PANEL
ALT: 76 U/L — ABNORMAL HIGH (ref 0–53)
AST: 38 U/L — ABNORMAL HIGH (ref 0–37)
Albumin: 4.6 g/dL (ref 3.5–5.2)
Alkaline Phosphatase: 78 U/L (ref 39–117)
BUN: 15 mg/dL (ref 6–23)
CO2: 26 mEq/L (ref 19–32)
Calcium: 9.7 mg/dL (ref 8.4–10.5)
Chloride: 104 mEq/L (ref 96–112)
Creatinine, Ser: 0.99 mg/dL (ref 0.40–1.50)
GFR: 85.66 mL/min (ref >60.00)
Glucose, Bld: 103 mg/dL — ABNORMAL HIGH (ref 70–99)
Potassium: 4.7 mEq/L (ref 3.5–5.1)
Sodium: 139 mEq/L (ref 135–145)
Total Bilirubin: 0.5 mg/dL (ref 0.2–1.2)
Total Protein: 6.9 g/dL (ref 6.0–8.3)

## 2023-01-02 LAB — CBC
HCT: 46.6 % (ref 39.0–52.0)
Hemoglobin: 16.2 g/dL (ref 13.0–17.0)
MCHC: 34.7 g/dL (ref 30.0–36.0)
MCV: 89.3 fl (ref 78.0–100.0)
Platelets: 199 10*3/uL (ref 150.0–400.0)
RBC: 5.22 Mil/uL (ref 4.22–5.81)
RDW: 13.6 % (ref 11.5–15.5)
WBC: 6.1 10*3/uL (ref 4.0–10.5)

## 2023-01-02 LAB — TSH: TSH: 3.7 u[IU]/mL (ref 0.35–5.50)

## 2023-01-02 LAB — VITAMIN D 25 HYDROXY (VIT D DEFICIENCY, FRACTURES): VITD: 60.52 ng/mL (ref 30.00–100.00)

## 2023-01-02 LAB — HEMOGLOBIN A1C: Hgb A1c MFr Bld: 6.1 % (ref 4.6–6.5)

## 2023-01-02 LAB — VITAMIN B12: Vitamin B-12: 605 pg/mL (ref 211–911)

## 2023-01-02 LAB — MAGNESIUM: Magnesium: 1.9 mg/dL (ref 1.5–2.5)

## 2023-01-02 NOTE — Progress Notes (Signed)
Please inform patient of the following:  His liver numbers are about the same as last time we checked. The rest of his labs ar all stable. His blood sugar is in the borderline range but stable. No significant abnormalities or anything that would explain his symptoms.  His liver numbers are probably nothing to worry about but it would be a good idea for [place] to get an ultrasound to check and make sure there is nothing else going on with  his liver. Please place order for RUQ [place] if he is agreeable.  Algis Greenhouse. Jerline Pain, MD 01/02/2023 4:08 PM

## 2023-01-02 NOTE — Assessment & Plan Note (Signed)
Still has quite a bit of issues with anxiety.  Overall feels like things are manageable.  We did discuss medications and therapy.  He is not interested in either of these at this time.  No reported SI or HI.  We discussed importance of stress reduction to help with supraphysiologic symptoms including spasms and tremor.

## 2023-01-02 NOTE — Patient Instructions (Signed)
It was very nice to see you today!  Your exam today is NORMAL.  We will check blood work today.  Please let us know if any new issues arise.  Take care, Dr Jerline Pain  PLEASE NOTE:  If you had any lab tests, please let us know if you have not heard back within a few days. You may see your results on mychart before we have a chance to review them but we will give you a call once they are reviewed by [place].   If we ordered any referrals today, please let us know if you have not heard from their office within the next week.   If you had any urgent prescriptions sent in today, please check with the pharmacy within an hour of our visit to make sure the prescription was transmitted appropriately.   Please try these tips to maintain a healthy lifestyle:  Eat at least 3 REAL meals and 1-2 snacks per day.  Aim for no more than 5 hours between eating.  If you eat breakfast, please do so within one hour of getting up.   Each meal should contain half fruits/vegetables, one quarter protein, and one quarter carbs (no bigger than a computer mouse)  Cut down on sweet beverages. This includes juice, soda, and sweet tea.   Drink at least 1 glass of water with each meal and aim for at least 8 glasses per day  Exercise at least 150 minutes every week.
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
## 2023-06-03 ENCOUNTER — Telehealth: Payer: Self-pay | Admitting: Family Medicine

## 2023-06-03 NOTE — Telephone Encounter (Signed)
See note

## 2023-06-03 NOTE — Telephone Encounter (Signed)
Pease see Triage note. Pt scheduled to see you tomorrow at 11:20.

## 2023-06-03 NOTE — Telephone Encounter (Signed)
FYI: This call has been transferred to Access Nurse. Once the result note has been entered staff can address the message at that time.  Patient called in with the following symptoms:  Red Word: Headache lasting 1 week. Associated symptoms: Dizziness, throbbing pain in temple area, slight sound/light sensitivity. No Hx of migraines to his knowledge.    Please advise at Mobile [phone] (mobile)  Message is routed to Provider Pool and [facility] Triage

## 2023-06-03 NOTE — Telephone Encounter (Signed)
Final Outcome: See PCP within 24 Hours. See below message for updates.   Patient Name First: Luis Last: Delgado Gender: Male DOB: 10/27/1966 Age: 57 Y 1 M 1 D Return Phone Number: [phone] (Primary) Address: City/ State/ Zip: [place]  [address] Client [facility] - Administrator, [facility] at [facility] Provider Jacquiline Doe- MD Contact Type Call Who Is Calling Patient / Member / Family / Caregiver Call Type Triage / Clinical Relationship To Patient Self Return Phone Number [phone] (Primary) Chief Complaint Dizziness Reason for Call Symptomatic / Request for Health Information Initial Comment Caller states he has a headache for the past week and dizziness. Translation No Nurse Assessment Nurse: Annye English, RN, Denise Date/Time (Eastern Time): 06/03/2023 11:04:08 AM Confirm and document reason for call. If symptomatic, describe symptoms. ---Caller states he has a headache for the past week and dizziness. Does the patient have any new or worsening symptoms? ---Yes Will a triage be completed? ---Yes Related visit to physician within the last 2 weeks? ---No Does the PT have any chronic conditions? (i.e. diabetes, asthma, this includes High risk factors for pregnancy, etc.) ---No Is this a behavioral health or substance abuse call? ---No Guidelines Guideline Title Affirmed Question Affirmed Notes Nurse Date/Time (Eastern Time) Headache [1] MODERATE headache (e.g., interferes with normal activities) AND [2] present > 24 hours AND [3] unexplained (Exceptions: analgesics not tried, typical migraine, or Carmon, RN, Angelique Blonder 06/03/2023 11:05:17 AM PLEASE NOTE: All timestamps contained within this report are represented as [place] Standard Time. CONFIDENTIALTY NOTICE: This fax transmission is intended only for the addressee. It contains information that is legally privileged, confidential or otherwise protected  from use or disclosure. If you are not the intended recipient, you are strictly prohibited from reviewing, disclosing, copying using or disseminating any of this information or taking any action in reliance on or regarding this information. If you have received this fax in error, please notify us immediately by telephone so that we can arrange for its return to [place]. Phone: [phone], Toll-Free: [phone], Fax: [phone] Page: 2 of 2 Call Id: [id number] Guidelines Guideline Title Affirmed Question Affirmed Notes Nurse Date/Time Lamount Cohen Time) headache part of viral illness) Disp. Time Lamount Cohen Time) Disposition Final User 06/03/2023 11:08:10 AM See PCP within 24 Hours Yes Carmon, RN, Angelique Blonder Final Disposition 06/03/2023 11:08:10 AM See PCP within 24 Hours Yes Carmon, RN, Leighton Ruff Disagree/Comply Comply Caller Understands No PreDisposition Call Doctor Care Advice Given Per Guideline SEE PCP WITHIN 24 HOURS: PAIN MEDICINES: * ACETAMINOPHEN - EXTRA STRENGTH TYLENOL: Take 1,000 mg (two 500 mg pills) every 6 to 8 hours as needed. Each Extra Strength Tylenol pill has 500 mg of acetaminophen. The most you should take is 6 pills a day (3,000 mg total). Note: In [place], the maximum is 8 pills a day (4,000 mg total). * IBUPROFEN (E.G., MOTRIN, ADVIL): Take 400 mg (two 200 mg pills) by mouth every 6 hours. The most you should take is 6 pills a day (1,200 mg total). CALL BACK IF: * You become worse CARE ADVICE given per Headache (Adult) guideline. Referrals REFERRED TO PCP OFFICE

## 2023-06-03 NOTE — Progress Notes (Signed)
Luis Delgado is a 57 y.o. male here for a new problem.  History of Present Illness:   No chief complaint on file.   HPI  Headache Has been having headache and dizziness since 05/26/23.   Past Medical History:  Diagnosis Date   Anxiety    Kidney stone    x 1 episode      Social History   Tobacco Use   Smoking status: Never   Smokeless tobacco: Never  Vaping Use   Vaping Use: Never used  Substance Use Topics   Alcohol use: No   Drug use: No    Past Surgical History:  Procedure Laterality Date   KNEE SURGERY     TONSILLECTOMY     VASECTOMY     VASECTOMY REVERSAL     WISDOM TOOTH EXTRACTION      Family History  Problem Relation Age of Onset   Colon cancer Neg Hx    Colon polyps Neg Hx    Esophageal cancer Neg Hx    Rectal cancer Neg Hx    Stomach cancer Neg Hx     No Known Allergies  Current Medications:   Current Outpatient Medications:    aspirin EC 81 MG tablet, Take 81 mg by mouth See admin instructions. Take 81 mg by mouth once daily four to five times a week, Disp: , Rfl:    Lysine 500 MG CAPS, Take 500 mg by mouth daily as needed (as directed- for flares of canker sores). , Disp: , Rfl:    Multiple Vitamins-Minerals (EMERGEN-C VITAMIN C PO), Take 1 packet by mouth daily. , Disp: , Rfl:    Multiple Vitamins-Minerals (MENS MULTIPLUS PO), Take 1 tablet by mouth daily with breakfast. , Disp: , Rfl:    Omega-3 Fatty Acids (FISH OIL) 1000 MG CAPS, Take 1,000 mg by mouth daily after breakfast. , Disp: , Rfl:    Review of Systems:   ROS  Vitals:   There were no vitals filed for this visit.   There is no height or weight on file to calculate BMI.  Physical Exam:   Physical Exam  Assessment and Plan:   ***   I,Alexander Ruley,acting as a scribe for Jarold Motto, PA.,have documented all relevant documentation on the behalf of Jarold Motto, PA,as directed by  Jarold Motto, PA while in the presence of Jarold Motto,  [place].   ***   Jarold Motto, PA-C

## 2023-06-03 NOTE — Telephone Encounter (Signed)
Patient called back stating triage informed him he needs to be seen within 24 hours. Pt has been scheduled for 06/04/23 @ 11:20am w/ Jarold Motto. Awaiting triage note.

## 2023-06-04 ENCOUNTER — Ambulatory Visit
Admission: RE | Admit: 2023-06-04 | Discharge: 2023-06-04 | Disposition: A | Discharge status: Home / Self Care | Payer: 59 | Source: Ambulatory Visit | Attending: Physician Assistant | Admitting: Physician Assistant

## 2023-06-04 ENCOUNTER — Ambulatory Visit: Payer: 59 | Admitting: Physician Assistant

## 2023-06-04 ENCOUNTER — Encounter: Payer: Self-pay | Admitting: Physician Assistant

## 2023-06-04 ENCOUNTER — Inpatient Hospital Stay: Admission: RE | Admit: 2023-06-04 | Payer: 59 | Source: Ambulatory Visit

## 2023-06-04 VITALS — BP 122/90 | HR 63 | Temp 98.0°F | Ht 70.0 in | Wt 249.2 lb

## 2023-06-04 DIAGNOSIS — R519 Headache, unspecified: Secondary | ICD-10-CM

## 2023-06-04 NOTE — Patient Instructions (Signed)
It was great to see you!  We are ordering MRI -- please stay by your phone - someone will call you to schedule this as soon as possible  If any new/worsening symptom(s), go to the ER   Take care,  Jarold Motto PA-C

## 2023-06-05 ENCOUNTER — Other Ambulatory Visit: Payer: 59

## 2023-08-16 DIAGNOSIS — H9202 Otalgia, left ear: Secondary | ICD-10-CM | POA: Diagnosis not present

## 2023-08-16 DIAGNOSIS — J029 Acute pharyngitis, unspecified: Secondary | ICD-10-CM | POA: Diagnosis not present

## 2023-09-17 DIAGNOSIS — Z8249 Family history of ischemic heart disease and other diseases of the circulatory system: Secondary | ICD-10-CM | POA: Diagnosis not present

## 2023-09-17 DIAGNOSIS — Z7982 Long term (current) use of aspirin: Secondary | ICD-10-CM | POA: Diagnosis not present

## 2023-09-17 DIAGNOSIS — H547 Unspecified visual loss: Secondary | ICD-10-CM | POA: Diagnosis not present

## 2023-09-17 DIAGNOSIS — Z823 Family history of stroke: Secondary | ICD-10-CM | POA: Diagnosis not present

## 2023-09-17 DIAGNOSIS — Z6835 Body mass index (BMI) 35.0-35.9, adult: Secondary | ICD-10-CM | POA: Diagnosis not present

## 2023-09-17 DIAGNOSIS — K219 Gastro-esophageal reflux disease without esophagitis: Secondary | ICD-10-CM | POA: Diagnosis not present

## 2023-09-17 DIAGNOSIS — Z973 Presence of spectacles and contact lenses: Secondary | ICD-10-CM | POA: Diagnosis not present

## 2023-09-17 DIAGNOSIS — E669 Obesity, unspecified: Secondary | ICD-10-CM | POA: Diagnosis not present

## 2024-03-17 ENCOUNTER — Encounter (HOSPITAL_BASED_OUTPATIENT_CLINIC_OR_DEPARTMENT_OTHER): Payer: Self-pay

## 2024-03-17 ENCOUNTER — Emergency Department (HOSPITAL_BASED_OUTPATIENT_CLINIC_OR_DEPARTMENT_OTHER): Admitting: Radiology

## 2024-03-17 ENCOUNTER — Emergency Department (HOSPITAL_BASED_OUTPATIENT_CLINIC_OR_DEPARTMENT_OTHER)
Admission: EM | Admit: 2024-03-17 | Discharge: 2024-03-17 | Disposition: A | Discharge status: Home / Self Care | Attending: Student | Admitting: Student

## 2024-03-17 ENCOUNTER — Other Ambulatory Visit: Payer: Self-pay

## 2024-03-17 DIAGNOSIS — Z7982 Long term (current) use of aspirin: Secondary | ICD-10-CM | POA: Diagnosis not present

## 2024-03-17 DIAGNOSIS — R0789 Other chest pain: Secondary | ICD-10-CM | POA: Insufficient documentation

## 2024-03-17 DIAGNOSIS — R519 Headache, unspecified: Secondary | ICD-10-CM | POA: Diagnosis not present

## 2024-03-17 DIAGNOSIS — R079 Chest pain, unspecified: Secondary | ICD-10-CM

## 2024-03-17 LAB — CBC
HCT: 46.4 % (ref 39.0–52.0)
Hemoglobin: 16.2 g/dL (ref 13.0–17.0)
MCH: 30.2 pg (ref 26.0–34.0)
MCHC: 34.9 g/dL (ref 30.0–36.0)
MCV: 86.6 fL (ref 80.0–100.0)
Platelets: 184 10*3/uL (ref 150–400)
RBC: 5.36 MIL/uL (ref 4.22–5.81)
RDW: 13.4 % (ref 11.5–15.5)
WBC: 6 10*3/uL (ref 4.0–10.5)
nRBC: 0 % (ref 0.0–0.2)

## 2024-03-17 LAB — BASIC METABOLIC PANEL
Anion gap: 10 (ref 5–15)
BUN: 17 mg/dL (ref 6–20)
CO2: 26 mmol/L (ref 22–32)
Calcium: 9.3 mg/dL (ref 8.9–10.3)
Chloride: 102 mmol/L (ref 98–111)
Creatinine, Ser: 1.08 mg/dL (ref 0.61–1.24)
GFR, Estimated: >60 mL/min (ref >60)
Glucose, Bld: 106 mg/dL — ABNORMAL HIGH (ref 70–99)
Potassium: 4.2 mmol/L (ref 3.5–5.1)
Sodium: 138 mmol/L (ref 135–145)

## 2024-03-17 LAB — TROPONIN I (HIGH SENSITIVITY)
Troponin I (High Sensitivity): 4 ng/L (ref <18)
Troponin I (High Sensitivity): 4 ng/L (ref <18)

## 2024-03-17 LAB — D-DIMER, QUANTITATIVE: D-Dimer, Quant: <0.27 ug{FEU}/mL (ref 0.00–0.50)

## 2024-03-17 MED ORDER — PANTOPRAZOLE SODIUM 20 MG PO TBEC: 20.0000 mg | DELAYED_RELEASE_TABLET | Freq: Every day | ORAL | 0 refills | Status: AC

## 2024-03-17 MED ORDER — ALUM & MAG HYDROXIDE-SIMETH 200-200-20 MG/5ML PO SUSP: 15.0000 mL | Freq: Once | ORAL | Status: DC

## 2024-03-17 MED ORDER — PANTOPRAZOLE SODIUM 40 MG PO TBEC
40.0000 mg | DELAYED_RELEASE_TABLET | Freq: Once | ORAL | Status: AC
  Administered 2024-03-17: 40 mg via ORAL
  Filled 2024-03-17: qty 1

## 2024-03-17 NOTE — Discharge Instructions (Addendum)
 You were seen in the emergency department today for chest pain.  As we discussed your lab work, EKG, chest x-ray all looked reassuring today.    I sent a medication to the pharmacy to help with reflux and any stomach ulcers.  Continue to monitor how you are doing overall, and return to the emergency department for any new or worsening symptoms such as: Worsening pain or pain with exertion, difficulty breathing, sweating, or pain or swelling in your legs.

## 2024-03-17 NOTE — ED Provider Notes (Signed)
 [place] EMERGENCY DEPARTMENT AT [facility] Provider Note   CSN: [id number] Arrival date & time: 03/17/24  1222     History  Chief Complaint  Patient presents with   Chest Pain    Luis Delgado is a 58 y.o. male with history of anxiety, kidney stones, who presents emergency department complaining of chest pain for the past 3 days.  Describes pain as constant pressure.  Also associated headache.  Pain is not exertional.  Does not feel short of breath.  No prior cardiac history. Sent from [facility] for further evaluation.   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  Most recently returned on 3/2, flies out again in a few weeks.  No history of blood clots, but does raise some concern for potential PE with his frequent long travel.  No leg pain or swelling.   Chest Pain Associated symptoms: headache        Home Medications Prior to Admission medications   Medication Sig Start Date End Date Taking? Authorizing Provider  pantoprazole (PROTONIX) 20 MG tablet Take 1 tablet (20 mg total) by mouth daily. 03/17/24 04/16/24 Yes Woodrow Drab T, PA-C  aspirin EC 81 MG tablet Take 81 mg by mouth See admin instructions. Take 81 mg by mouth once daily four to five times a week    [provider]  Lysine 500 MG CAPS Take 500 mg by mouth daily as needed (as directed- for flares of canker sores).     [provider]  Multiple Vitamins-Minerals (EMERGEN-C VITAMIN C PO) Take 1 packet by mouth daily.     [provider]  Multiple Vitamins-Minerals (MENS MULTIPLUS PO) Take 1 tablet by mouth daily with breakfast.     [provider]      Allergies    Patient has no known allergies.    Review of Systems   Review of Systems  Cardiovascular:  Positive for chest pain.  Neurological:  Positive for headaches.  All other systems reviewed and are negative.   Physical Exam Updated Vital Signs BP 115/68 (BP Location: Right Arm)   Pulse (!)  58   Temp 97.7 F (36.5 C) (Oral)   Resp 16   Ht 5\' 10"  (1.778 m)   Wt 113.4 kg   SpO2 98%   BMI 35.87 kg/m  Physical Exam Vitals and nursing note reviewed.  Constitutional:      Appearance: Normal appearance.  HENT:     Head: Normocephalic and atraumatic.  Eyes:     Conjunctiva/sclera: Conjunctivae normal.  Cardiovascular:     Rate and Rhythm: Normal rate and regular rhythm.     Pulses:          Posterior tibial pulses are 2+ on the right side and 2+ on the left side.  Pulmonary:     Effort: Pulmonary effort is normal. No respiratory distress.     Breath sounds: Normal breath sounds.  Abdominal:     General: There is no distension.     Palpations: Abdomen is soft.     Tenderness: There is no abdominal tenderness.  Musculoskeletal:     Right lower leg: No edema.     Left lower leg: No edema.  Skin:    General: Skin is warm and dry.  Neurological:     General: No focal deficit present.     Mental Status: He is alert.     ED Results / Procedures / Treatments   Labs (all labs ordered  are listed, but only abnormal results are displayed) Labs Reviewed  BASIC METABOLIC PANEL - Abnormal; Notable for the following components:      Result Value   Glucose, Bld 106 (*)    All other components within normal limits  CBC  D-DIMER, QUANTITATIVE  TROPONIN I (HIGH SENSITIVITY)  TROPONIN I (HIGH SENSITIVITY)    EKG None  Radiology DG Chest 2 View Result Date: 03/17/2024 CLINICAL DATA:  Chest pain and pressure for 3 days. EXAM: CHEST - 2 VIEW COMPARISON:  11/14/2019 FINDINGS: The heart size and mediastinal contours are within normal limits. Both lungs are clear. The visualized skeletal structures are unremarkable. IMPRESSION: No active cardiopulmonary disease. Electronically Signed   By: Danae Orleans M.D.   On: 03/17/2024 15:21    Procedures Procedures    Medications Ordered in ED Medications  pantoprazole (PROTONIX) EC tablet 40 mg (40 mg Oral Given 03/17/24 1356)     ED Course/ Medical Decision Making/ A&P             HEART Score: 2                    Medical Decision Making Amount and/or Complexity of Data Reviewed Labs: ordered. Radiology: ordered.  Risk Prescription drug management.   This patient is a 59 y.o. male  who presents to the ED for concern of chest pain.   Differential diagnoses prior to evaluation: The emergent differential diagnosis includes, but is not limited to,  ACS, pericarditis, myocarditis, aortic dissection, PE, pneumothorax, esophageal rupture, pneumonia, reflux/PUD, biliary disease, pancreatitis, costochondritis, anxiety. This is not an exhaustive differential.   Past Medical History / Co-morbidities / Social History: Anxiety, kidney stones  Additional history: Chart reviewed. Pertinent results include: Reviewed UC visit note from this AM  Physical Exam: Physical exam performed. The pertinent findings include: Normal vital signs, no acute distress.  Heart regular rate and rhythm, was not clear.  No peripheral edema.  Lab Tests/Imaging studies: I personally interpreted labs/imaging and the pertinent results include: CBC and BMP unremarkable.  Negative troponin x 2.  Negative D-dimer. Chest x-ray without acute abnormalities. I agree with the radiologist interpretation.  Cardiac monitoring: EKG obtained and interpreted by myself and attending physician which shows: Normal sinus rhythm   Medications: I ordered medication including Protonix.  I have reviewed the patients home medicines and have made adjustments as needed.   Disposition: After consideration of the diagnostic results and the patients response to treatment, I feel that emergency department workup does not suggest an emergent condition requiring admission or immediate intervention beyond what has been performed at this time. Patient is to be discharged with recommendation to follow up with PCP in regards to today's hospital visit. Chest pain is not  likely of cardiac or pulmonary etiology d/t presentation, D-dimer for PE negative, VSS, no tracheal deviation, no JVD or new murmur, RRR, breath sounds equal bilaterally, EKG without acute abnormalities, negative troponin, and negative CXR. Heart score of 2. Pt has been advised to return to the ED if CP becomes exertional, associated with diaphoresis or nausea, radiates to left jaw/arm, worsens or becomes concerning in any way. Pt appears reliable for follow up and is agreeable to discharge.   Final Clinical Impression(s) / ED Diagnoses Final diagnoses:  Nonspecific chest pain    Rx / DC Orders ED Discharge Orders          Ordered    pantoprazole (PROTONIX) 20 MG tablet  Daily  03/17/24 1528           Portions of this report may have been transcribed using voice recognition software. Every effort was made to ensure accuracy; however, inadvertent computerized transcription errors may be present.    Jeanella Flattery 03/17/24 1533    Glendora Score, MD 03/17/24 1844

## 2024-03-17 NOTE — ED Triage Notes (Signed)
 Pt to ED c/o chest pain x 3 days, constant in nature , "pressure in chest" Also reports HA, Denies SHOB. Reports pain is not worse with physical activity, Denies cardiac hx.
